# Patient Record
Sex: Male | Born: 1985 | Race: White | Hispanic: No | Marital: Single | Smoking: Never smoker
Health system: Southern US, Community
[De-identification: ages and names within clinical notes are randomized; demographics above are authoritative.]

## PROBLEM LIST (undated history)

## (undated) DIAGNOSIS — F845 Asperger's syndrome: Secondary | ICD-10-CM

## (undated) DIAGNOSIS — F4310 Post-traumatic stress disorder, unspecified: Secondary | ICD-10-CM

## (undated) DIAGNOSIS — F329 Major depressive disorder, single episode, unspecified: Secondary | ICD-10-CM

## (undated) DIAGNOSIS — F319 Bipolar disorder, unspecified: Secondary | ICD-10-CM

## (undated) DIAGNOSIS — F419 Anxiety disorder, unspecified: Secondary | ICD-10-CM

## (undated) DIAGNOSIS — F410 Panic disorder [episodic paroxysmal anxiety] without agoraphobia: Secondary | ICD-10-CM

## (undated) DIAGNOSIS — F259 Schizoaffective disorder, unspecified: Secondary | ICD-10-CM

## (undated) DIAGNOSIS — I10 Essential (primary) hypertension: Secondary | ICD-10-CM

## (undated) DIAGNOSIS — K219 Gastro-esophageal reflux disease without esophagitis: Secondary | ICD-10-CM

## (undated) DIAGNOSIS — F840 Autistic disorder: Secondary | ICD-10-CM

## (undated) DIAGNOSIS — F4481 Dissociative identity disorder: Secondary | ICD-10-CM

## (undated) DIAGNOSIS — F32A Depression, unspecified: Secondary | ICD-10-CM

## (undated) DIAGNOSIS — F250 Schizoaffective disorder, bipolar type: Secondary | ICD-10-CM

## (undated) HISTORY — DX: Autistic disorder: F84.0

## (undated) HISTORY — DX: Schizoaffective disorder, bipolar type: F25.0

## (undated) HISTORY — DX: Gastro-esophageal reflux disease without esophagitis: K21.9

## (undated) HISTORY — DX: Schizoaffective disorder, unspecified: F25.9

---
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
## 2009-04-14 ENCOUNTER — Emergency Department (HOSPITAL_COMMUNITY): Admission: EM | Admit: 2009-04-14 | Discharge: 2009-04-15 | Payer: Self-pay | Admitting: Emergency Medicine

## 2010-10-10 ENCOUNTER — Emergency Department (HOSPITAL_COMMUNITY)
Admission: EM | Admit: 2010-10-10 | Discharge: 2010-10-14 | Disposition: A | Discharge status: Other Acute Inpatient Hospital | Payer: Medicaid Other | Source: Home / Self Care | Attending: Emergency Medicine | Admitting: Emergency Medicine

## 2010-10-10 DIAGNOSIS — R45851 Suicidal ideations: Secondary | ICD-10-CM | POA: Insufficient documentation

## 2010-10-10 DIAGNOSIS — Z79899 Other long term (current) drug therapy: Secondary | ICD-10-CM | POA: Insufficient documentation

## 2010-10-10 DIAGNOSIS — F840 Autistic disorder: Secondary | ICD-10-CM | POA: Insufficient documentation

## 2010-10-10 DIAGNOSIS — F4481 Dissociative identity disorder: Secondary | ICD-10-CM | POA: Insufficient documentation

## 2010-10-10 DIAGNOSIS — F319 Bipolar disorder, unspecified: Secondary | ICD-10-CM | POA: Insufficient documentation

## 2010-10-10 DIAGNOSIS — F39 Unspecified mood [affective] disorder: Secondary | ICD-10-CM | POA: Insufficient documentation

## 2010-10-10 DIAGNOSIS — F29 Unspecified psychosis not due to a substance or known physiological condition: Secondary | ICD-10-CM | POA: Insufficient documentation

## 2010-10-10 DIAGNOSIS — F848 Other pervasive developmental disorders: Secondary | ICD-10-CM | POA: Insufficient documentation

## 2010-10-10 DIAGNOSIS — F411 Generalized anxiety disorder: Secondary | ICD-10-CM | POA: Insufficient documentation

## 2010-10-10 DIAGNOSIS — I7300 Raynaud's syndrome without gangrene: Secondary | ICD-10-CM | POA: Insufficient documentation

## 2010-10-10 DIAGNOSIS — IMO0002 Reserved for concepts with insufficient information to code with codable children: Secondary | ICD-10-CM | POA: Insufficient documentation

## 2010-10-10 LAB — CBC
HCT: 46.2 % (ref 39.0–52.0)
Hemoglobin: 15.8 g/dL (ref 13.0–17.0)
MCHC: 34.2 g/dL (ref 30.0–36.0)
MCV: 88.5 fL (ref 78.0–100.0)
Platelets: 189 10*3/uL (ref 150–400)
RBC: 5.22 MIL/uL (ref 4.22–5.81)
RDW: 12.6 % (ref 11.5–15.5)
WBC: 8.6 10*3/uL (ref 4.0–10.5)

## 2010-10-10 LAB — BASIC METABOLIC PANEL
CO2: 26 mEq/L (ref 19–32)
Calcium: 8.9 mg/dL (ref 8.4–10.5)
Chloride: 105 mEq/L (ref 96–112)
Creatinine, Ser: 1.05 mg/dL (ref 0.4–1.5)
GFR calc Af Amer: >60 mL/min (ref >60)
GFR calc non Af Amer: >60 mL/min (ref >60)
Potassium: 3.9 mEq/L (ref 3.5–5.1)
Sodium: 138 mEq/L (ref 135–145)

## 2010-10-10 LAB — DIFFERENTIAL
Basophils Absolute: 0 10*3/uL (ref 0.0–0.1)
Basophils Relative: 0 % (ref 0–1)
Eosinophils Absolute: 0.3 10*3/uL (ref 0.0–0.7)
Eosinophils Relative: 3 % (ref 0–5)
Lymphs Abs: 3.1 10*3/uL (ref 0.7–4.0)
Monocytes Absolute: 0.7 10*3/uL (ref 0.1–1.0)
Monocytes Relative: 8 % (ref 3–12)
Neutro Abs: 4.5 10*3/uL (ref 1.7–7.7)
Neutrophils Relative %: 53 % (ref 43–77)

## 2010-10-10 LAB — ETHANOL: Alcohol, Ethyl (B): <5 mg/dL (ref 0–10)

## 2010-10-11 LAB — RAPID URINE DRUG SCREEN, HOSP PERFORMED
Amphetamines: POSITIVE — AB
Barbiturates: NOT DETECTED
Cocaine: NOT DETECTED
Opiates: NOT DETECTED
Tetrahydrocannabinol: NOT DETECTED

## 2010-10-14 ENCOUNTER — Inpatient Hospital Stay (HOSPITAL_COMMUNITY)
Admission: AD | Admit: 2010-10-14 | Discharge: 2010-10-18 | DRG: 885 | Disposition: A | Discharge status: Home / Self Care | Payer: Medicaid Other | Source: Ambulatory Visit | Attending: Psychiatry | Admitting: Psychiatry

## 2010-10-14 DIAGNOSIS — I10 Essential (primary) hypertension: Secondary | ICD-10-CM

## 2010-10-14 DIAGNOSIS — F29 Unspecified psychosis not due to a substance or known physiological condition: Secondary | ICD-10-CM

## 2010-10-14 DIAGNOSIS — K219 Gastro-esophageal reflux disease without esophagitis: Secondary | ICD-10-CM

## 2010-10-14 DIAGNOSIS — R45851 Suicidal ideations: Secondary | ICD-10-CM

## 2010-10-14 DIAGNOSIS — F609 Personality disorder, unspecified: Secondary | ICD-10-CM

## 2010-10-14 DIAGNOSIS — F4481 Dissociative identity disorder: Secondary | ICD-10-CM

## 2010-10-14 DIAGNOSIS — F39 Unspecified mood [affective] disorder: Principal | ICD-10-CM

## 2010-10-15 DIAGNOSIS — F200 Paranoid schizophrenia: Secondary | ICD-10-CM

## 2010-10-19 NOTE — H&P (Signed)
NAMETREAVON, Peter Elliott              ACCOUNT NO.:  [id number]  MEDICAL RECORD NO.:  [id number]           PATIENT TYPE:  I  LOCATION:  0404                          FACILITY:  [facility]  PHYSICIAN:  Eulogio Ditch, MD DATE OF BIRTH:  03/05/1986  DATE OF ADMISSION:  10/14/2010 DATE OF DISCHARGE:                      PSYCHIATRIC ADMISSION ASSESSMENT   TIME:  1330.  IDENTIFYING INFORMATION:  A 25 year old male, single.  This is a voluntary admission.  HISTORY OF PRESENT ILLNESS:  First [facility] admission for Peter Elliott, who presents after his friend brought him to the emergency room after Peter Elliott was attempting to strangle himself.  He said one of his 48+ personalities was acting out against him and trying to strangle him.  He reported in the emergency room that he has several personalities which are aggressive towards him and others which are protective of him.  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  He was somewhat agitated in the emergency room, which he attributed to several of his personalities, which resolved without medications when he was threatened with emergency sedation.  Today he is sleep in the bed, responds to voice, but falls back asleep, unable to give a thorough history.  PAST PSYCHIATRIC HISTORY:  Currently followed as an outpatient at [facility] in [place], by Dr. Yancey Flemings and Lesia Sago, his counselor.  His history reflects a past diagnosis of auditory processing disorder, multiple personality disorder, and possibly Asperger syndrome.  He has a history of previous inpatient admission at [facility].  SOCIAL HISTORY:  Single Caucasian male who lives with a roommate.  FAMILY HISTORY:  Not available.  MEDICAL HISTORY:  Primary care provider is unknown.  MEDICAL PROBLEMS:  GERD, perennial allergies, and hypertension.  CURRENT MEDICATIONS: 1. Seroquel 500 mg p.o. q.h.s. 2.  Fluticasone 2 sprays to each nostril b.i.d. 3. Lamotrigine 100 mg q.a.m. 4. Metoprolol 50 mg q.a.m. 5. Ranitidine 150 mg q.a.m. 6. Cetirizine 10 mg daily. 7. Omeprazole 20 mg b.i.d.  DRUG ALLERGIES:  None.  PHYSICAL EXAMINATION:  Was done in the emergency room. GENERAL:  This is an obese Caucasian male in no acute distress, 5 feet 6 inches tall, 114 kg. ADMITTING VITAL SIGNS:  Temperature 98.4, pulse 114, respirations 16, blood pressure 144/79.  Normally-developed, appears generally healthy. Normal motor exam.  AIMS score is 0.  Urine drug screen positive for amphetamines.  Chemistry:  Sodium 138, potassium 3.9, chloride 105, carbon dioxide 26, BUN 13, creatinine 1.05, random glucose 159.  Alcohol level negative.  CBC normal, hemoglobin 15.8.  MENTAL STATUS EXAMINATION:  Sleepy Caucasian male, cooperative.  Answers when I call his name immediately, but readily falls back to sleep. Having trouble attending to any conversation.  Oriented to person, place, and basic situation.  Answers a few questions about medications and medical information.  Denying any dangerous thoughts today.  DIAGNOSES:  Axis I:  Mood disorder not otherwise specified. Axis II:  No diagnosis. Axis III:  Perennial allergies, high blood pressure. Axis IV:  Deferred. Axis V:  Current 35, past year not known.  PLAN:  Voluntarily admit him with a goal of  stabilizing his mood and functioning.  We have changed his Seroquel to the XR variety and will give him 500 mg q.h.s. and will continue his other routine medications and hopefully hear from family when he is able to participate better in interview.     Margaret A. Lorin Picket, N.P.   ______________________________ Eulogio Ditch, MD    MAS/MEDQ  D:  10/15/2010  T:  10/15/2010  Job:  [id number]  Electronically Signed by Kari Baars N.P. on 10/18/2010 03:44:09 PM Electronically Signed by Eulogio Ditch  on 10/19/2010 07:18:20 AM

## 2010-11-01 LAB — POCT CARDIAC MARKERS
CKMB, poc: <1 ng/mL — ABNORMAL LOW (ref 1.0–8.0)
Myoglobin, poc: 51.4 ng/mL (ref 12–200)
Troponin i, poc: <0.05 ng/mL (ref 0.00–0.09)

## 2010-11-01 LAB — CBC
HCT: 46.3 % (ref 39.0–52.0)
Hemoglobin: 16.1 g/dL (ref 13.0–17.0)
MCHC: 34.8 g/dL (ref 30.0–36.0)
MCV: 90.5 fL (ref 78.0–100.0)
Platelets: 159 10*3/uL (ref 150–400)
RBC: 5.11 MIL/uL (ref 4.22–5.81)
RDW: 12.9 % (ref 11.5–15.5)
WBC: 8.2 10*3/uL (ref 4.0–10.5)

## 2010-11-01 LAB — DIFFERENTIAL
Basophils Absolute: 0.1 10*3/uL (ref 0.0–0.1)
Basophils Relative: 1 % (ref 0–1)
Eosinophils Absolute: 0.4 10*3/uL (ref 0.0–0.7)
Eosinophils Relative: 5 % (ref 0–5)
Lymphocytes Relative: 24 % (ref 12–46)
Lymphs Abs: 2 10*3/uL (ref 0.7–4.0)
Monocytes Absolute: 0.5 10*3/uL (ref 0.1–1.0)
Monocytes Relative: 7 % (ref 3–12)
Neutro Abs: 5.2 10*3/uL (ref 1.7–7.7)
Neutrophils Relative %: 63 % (ref 43–77)

## 2010-11-01 LAB — D-DIMER, QUANTITATIVE: D-Dimer, Quant: 0.24 ug/mL-FEU (ref 0.00–0.48)

## 2010-11-01 LAB — BASIC METABOLIC PANEL
BUN: 14 mg/dL (ref 6–23)
CO2: 30 mEq/L (ref 19–32)
Calcium: 9 mg/dL (ref 8.4–10.5)
Chloride: 101 mEq/L (ref 96–112)
Creatinine, Ser: 0.96 mg/dL (ref 0.4–1.5)
GFR calc Af Amer: >60 mL/min (ref >60)
GFR calc non Af Amer: >60 mL/min (ref >60)
Glucose, Bld: 98 mg/dL (ref 70–99)
Potassium: 3.7 mEq/L (ref 3.5–5.1)
Sodium: 139 mEq/L (ref 135–145)

## 2010-11-01 LAB — URINALYSIS, ROUTINE W REFLEX MICROSCOPIC
Bilirubin Urine: NEGATIVE
Glucose, UA: NEGATIVE mg/dL
Hgb urine dipstick: NEGATIVE
Ketones, ur: NEGATIVE mg/dL
Nitrite: NEGATIVE
Protein, ur: NEGATIVE mg/dL
Specific Gravity, Urine: 1.024 (ref 1.005–1.030)
Urobilinogen, UA: 0.2 mg/dL (ref 0.0–1.0)
pH: 7 (ref 5.0–8.0)

## 2010-11-01 LAB — RAPID URINE DRUG SCREEN, HOSP PERFORMED
Amphetamines: NOT DETECTED
Barbiturates: NOT DETECTED
Benzodiazepines: NOT DETECTED
Cocaine: NOT DETECTED
Opiates: NOT DETECTED
Tetrahydrocannabinol: NOT DETECTED

## 2010-11-01 LAB — ETHANOL: Alcohol, Ethyl (B): <5 mg/dL (ref 0–10)

## 2010-11-16 NOTE — Discharge Summary (Signed)
NAMEJAKORIAN, MARENGO              ACCOUNT NO.:  [id number]  MEDICAL RECORD NO.:  [id number]           PATIENT TYPE:  I  LOCATION:  0404                          FACILITY:  BH  PHYSICIAN:  Margaret A. Scott, N.P.DATE OF BIRTH:  17-Apr-1986  DATE OF ADMISSION:  10/14/2010 DATE OF DISCHARGE:  10/18/2010                              DISCHARGE SUMMARY   IDENTIFYING INFORMATION:  This is a 25 year old male who is single. This was a voluntary admission.  HISTORY OF PRESENT ILLNESS:  This was the first [facility] admission for Schawn who presented by way of our emergency room where his friends brought him after he attempted to strangle himself.  He reported that one of his 100+ alternate personalities was acting out against him and was the personality that was trying to strangle him.  He reported in the emergency room that he had several aggressive personalities and other personalities which are protective of him.  He was somewhat agitated in the emergency room which he had attributed to several of his alter personalities, but the agitation resolved without medications when he was threatened with emergency sedation.  He is currently followed as an outpatient at [facility] in [place], [place] by Dr. Yancey Flemings and Lesia Sago, his counselor.  He reported a history of auditory processing disorder, multiple personality disorder and possible Asperger syndrome.  He also reported previous admissions to [facility].  This is a single Caucasian male, never married, who lives with a roommate.  MEDICAL EVALUATION:  Chronic medical problems include GERD, perennial allergies and hypertension, and his full physical exam was done in the emergency room.  Vital signs were normal at admission.  He appears healthy with no abnormal movements.  Urine drug screen positive for amphetamines.  Chemistry normal.  BUN 13, creatinine 1.05.  Alcohol level negative.  CBC normal.   Hemoglobin 15.8.  COURSE OF HOSPITALIZATION:  He was admitted to our stabilization unit with a goal of stabilizing his mood and affect and alleviating any suicidal thoughts.  His participation on our unit was appropriate, and he had no episodes of dissociative behavior.  We elected to change his Seroquel to 500 mg p.o. q.h.s. and continued his routine blood pressure medicine and omeprazole 20 mg daily.  We also continued lamotrigine 100 mg daily which she was taking at home.  He was gradually assimilated into the milieu.  He was given an initial working diagnosis of schizoaffective disorder.  He gave permission for [place] to talk with his sister and his current roommate with whom he had a previous relationship.  For the first couple of days, he reported that he was still hearing voices telling him to kill myself.  We made contact with the patient's psychologist, Dr. Lesia Sago from [facility], who advised [place] on his recent course of treatment and counseling with Janyth Pupa.  We arranged for follow-up appointment with him on Monday, March 26.  Meanwhile, we received his records from [facility] we had previously been admitted and they revealed a previous diagnosis of autism spectrum disorder, hypomania and dissociative identity disorder.  Meanwhile, we had elected to start him  on Haldol 5 mg q.h.s. in addition to the Seroquel XR 500 mg q.h.s., and he reported that he was feeling better and the auditory hallucinations that he was having had resolved by March 22.  By May 23, we made contact with his roommate who reported he had no concerns with him coming home.  Had spoken with him and felt that he could be safe.  He was receptive to suicide prevention education which was provided.  His roommate had also spoken with Janyth Pupa who had reported to him that he felt that his thoughts were no longer racing and that he was doing better on the Haldol.  By 03/23, he was in full contact with reality,  responding well to medication with no extrapyramidal symptoms and denying suicidal thoughts.  He was requesting discharge to return home in follow-up outpatient treatment.  DISCHARGE INSTRUCTIONS:  Follow up with Lesia Sago on March 26 at 4 o'clock p.m. and with Dr. Yancey Flemings March 30 at 9:15.  DISCHARGE DIAGNOSES:  AXIS I:  Mood disorder NOS.  Autism spectrum disorder NOS. AXIS II: Dissociative identity disorder by history. AXIS III: Perennial allergies, hypertension, GERD, AXIS IV: No diagnosis. AXIS V: Current 57, past year not known.  DISCHARGE CONDITION:  Stable.  DISCHARGE MEDICATIONS: 1. Haldol 10 mg p.o. q.h.s. 2. Quetiapine XR 500 mg q.h.s. 3. Cetirizine 10 mg daily. 4. Flonase 2 sprays both nostrils t.i.d. 5. Lamotrigine 100 mg daily. 6. Metoprolol 50 mg daily. 7. Omeprazole 20 mg b.i.d. 8. Ranitidine 150 mg daily.     Margaret A. Lorin Picket, N.P.     MAS/MEDQ  D:  11/11/2010  T:  11/11/2010  Job:  [id number]  Electronically Signed by Kari Baars N.P. on 11/12/2010 08:31:11 AM Electronically Signed by Eulogio Ditch  on 11/16/2010 05:33:50 AM

## 2011-12-15 ENCOUNTER — Encounter (HOSPITAL_COMMUNITY): Payer: Self-pay | Admitting: Emergency Medicine

## 2011-12-15 ENCOUNTER — Emergency Department (HOSPITAL_COMMUNITY)
Admission: EM | Admit: 2011-12-15 | Discharge: 2011-12-16 | Disposition: A | Discharge status: Other Acute Inpatient Hospital | Payer: Medicaid Other | Attending: Emergency Medicine | Admitting: Emergency Medicine

## 2011-12-15 DIAGNOSIS — F3289 Other specified depressive episodes: Secondary | ICD-10-CM | POA: Insufficient documentation

## 2011-12-15 DIAGNOSIS — Z79899 Other long term (current) drug therapy: Secondary | ICD-10-CM | POA: Insufficient documentation

## 2011-12-15 DIAGNOSIS — F411 Generalized anxiety disorder: Secondary | ICD-10-CM | POA: Insufficient documentation

## 2011-12-15 DIAGNOSIS — F329 Major depressive disorder, single episode, unspecified: Secondary | ICD-10-CM | POA: Insufficient documentation

## 2011-12-15 DIAGNOSIS — I10 Essential (primary) hypertension: Secondary | ICD-10-CM | POA: Insufficient documentation

## 2011-12-15 DIAGNOSIS — R45850 Homicidal ideations: Secondary | ICD-10-CM | POA: Insufficient documentation

## 2011-12-15 DIAGNOSIS — R45851 Suicidal ideations: Secondary | ICD-10-CM | POA: Insufficient documentation

## 2011-12-15 HISTORY — DX: Essential (primary) hypertension: I10

## 2011-12-15 LAB — CBC
HCT: 44.4 % (ref 39.0–52.0)
Hemoglobin: 15.6 g/dL (ref 13.0–17.0)
MCH: 30.6 pg (ref 26.0–34.0)
MCHC: 35.1 g/dL (ref 30.0–36.0)
Platelets: 154 10*3/uL (ref 150–400)
RBC: 5.1 MIL/uL (ref 4.22–5.81)
RDW: 12.9 % (ref 11.5–15.5)
WBC: 6.2 10*3/uL (ref 4.0–10.5)

## 2011-12-15 LAB — BASIC METABOLIC PANEL
BUN: 16 mg/dL (ref 6–23)
CO2: 28 mEq/L (ref 19–32)
Calcium: 9.6 mg/dL (ref 8.4–10.5)
Chloride: 101 mEq/L (ref 96–112)
Creatinine, Ser: 0.86 mg/dL (ref 0.50–1.35)
GFR calc Af Amer: >90 mL/min (ref >90)
Glucose, Bld: 95 mg/dL (ref 70–99)
Potassium: 3.7 mEq/L (ref 3.5–5.1)

## 2011-12-15 LAB — DIFFERENTIAL
Basophils Absolute: 0 10*3/uL (ref 0.0–0.1)
Basophils Relative: 1 % (ref 0–1)
Eosinophils Absolute: 0.2 10*3/uL (ref 0.0–0.7)
Eosinophils Relative: 3 % (ref 0–5)
Lymphocytes Relative: 37 % (ref 12–46)
Monocytes Absolute: 0.4 10*3/uL (ref 0.1–1.0)
Neutro Abs: 3.3 10*3/uL (ref 1.7–7.7)
Neutrophils Relative %: 53 % (ref 43–77)

## 2011-12-15 MED ORDER — ACETAMINOPHEN 325 MG PO TABS
650.0000 mg | ORAL_TABLET | ORAL | Status: DC | PRN
  Administered 2011-12-16: 650 mg via ORAL
  Filled 2011-12-15: qty 2

## 2011-12-15 MED ORDER — FLUTICASONE PROPIONATE 50 MCG/ACT NA SUSP
2.0000 | Freq: Every day | NASAL | Status: DC
  Administered 2011-12-16: 2 via NASAL
  Filled 2011-12-15 (×2): qty 16

## 2011-12-15 MED ORDER — ZOLPIDEM TARTRATE 5 MG PO TABS: 5.0000 mg | ORAL_TABLET | Freq: Every evening | ORAL | Status: DC | PRN

## 2011-12-15 MED ORDER — METOPROLOL TARTRATE 25 MG PO TABS
50.0000 mg | ORAL_TABLET | Freq: Every day | ORAL | Status: DC
  Administered 2011-12-16: 50 mg via ORAL
  Filled 2011-12-15: qty 2

## 2011-12-15 MED ORDER — LURASIDONE HCL 120 MG PO TABS
1.0000 | ORAL_TABLET | Freq: Every day | ORAL | Status: DC
  Administered 2011-12-16: 120 mg via ORAL
  Filled 2011-12-15 (×2): qty 1

## 2011-12-15 MED ORDER — NICOTINE 21 MG/24HR TD PT24
21.0000 mg | MEDICATED_PATCH | Freq: Every day | TRANSDERMAL | Status: DC
  Filled 2011-12-15: qty 1

## 2011-12-15 MED ORDER — DIVALPROEX SODIUM 250 MG PO DR TAB
500.0000 mg | DELAYED_RELEASE_TABLET | Freq: Three times a day (TID) | ORAL | Status: DC
  Administered 2011-12-15 – 2011-12-16 (×3): 500 mg via ORAL
  Filled 2011-12-15 (×3): qty 2

## 2011-12-15 MED ORDER — LORAZEPAM 1 MG PO TABS: 1.0000 mg | ORAL_TABLET | Freq: Three times a day (TID) | ORAL | Status: DC | PRN

## 2011-12-15 MED ORDER — ONDANSETRON HCL 8 MG PO TABS
4.0000 mg | ORAL_TABLET | Freq: Three times a day (TID) | ORAL | Status: DC | PRN
Start: 2011-12-15 — End: 2011-12-16

## 2011-12-15 MED ORDER — IBUPROFEN 200 MG PO TABS: 600.0000 mg | ORAL_TABLET | Freq: Three times a day (TID) | ORAL | Status: DC | PRN

## 2011-12-15 NOTE — ED Notes (Signed)
Patient belongings secured in ED locker # 9. Inventory sheet completed and in chart

## 2011-12-15 NOTE — ED Notes (Signed)
ACT team at bedside.  

## 2011-12-15 NOTE — ED Notes (Signed)
Peter Elliott with ACT is aware of patient and will see him shortly

## 2011-12-15 NOTE — ED Notes (Signed)
Patient given [place] sandwhich, pretzels, cheese stick, apple sauce, peanut butter crackers, shortbread cookies and coke.

## 2011-12-15 NOTE — ED Notes (Signed)
PT. REPORTS SUICIDAL IDEATION AND HOMICIDAL IDEATION ONSET TODAY , PLANS TO JUMP OVER BRIDGE AND DID NOT SPECIFY PLAN OF HOMICIDE , DENIES AUDITORY AND VISUAL HALLUCINATIONS . PAPER SCRUBS GIVEN TO PT.,  SECURITY PAGED TO WAND PT. AC NOTIFIED FOR SITTER.

## 2011-12-15 NOTE — ED Notes (Signed)
Informed patient that we need a urine sample. Gave patient a urinal.

## 2011-12-15 NOTE — ED Provider Notes (Signed)
History     CSN: [id number]  Arrival date & time 12/15/11  2102   First MD Initiated Contact with Patient 12/15/11 2209      No chief complaint on file.   (Consider location/radiation/quality/duration/timing/severity/associated sxs/prior treatment) Patient is a 26 y.o. male presenting with mental health disorder. The history is provided by the patient. No language interpreter was used.  Mental Health Problem The primary symptoms do not include delusions, hallucinations, negative symptoms or somatic symptoms. The current episode started today.  The onset of the illness is precipitated by a stressful event and emotional stress. The degree of incapacity that he is experiencing as a consequence of his illness is moderate. Sequelae of the illness include homelessness. Additional symptoms of the illness include feelings of worthlessness. Additional symptoms of the illness do not include no anhedonia, no insomnia, no hypersomnia, no appetite change, no fatigue, no psychomotor retardation, no headaches or no abdominal pain. He admits to suicidal ideas. He does have a plan to commit suicide. He contemplates injuring another person. He has not already  injured another person. Risk factors that are present for mental illness include a history of mental illness.    Past Medical History  Diagnosis Date  . Hypertension     History reviewed. No pertinent past surgical history.  No family history on file.  History  Substance Use Topics  . Smoking status: Never Smoker   . Smokeless tobacco: Not on file  . Alcohol Use: No      Review of Systems  Constitutional: Negative for fever, activity change, appetite change and fatigue.  HENT: Negative for congestion, sore throat, rhinorrhea, neck pain and neck stiffness.   Respiratory: Negative for cough and shortness of breath.   Cardiovascular: Negative for chest pain and palpitations.  Gastrointestinal: Negative for nausea, vomiting and abdominal  pain.  Genitourinary: Negative for dysuria, urgency, frequency and flank pain.  Musculoskeletal: Negative for myalgias, back pain and arthralgias.  Neurological: Negative for dizziness, weakness, light-headedness, numbness and headaches.  Psychiatric/Behavioral: Positive for suicidal ideas. Negative for hallucinations. The patient is nervous/anxious. The patient does not have insomnia.   All other systems reviewed and are negative.    Allergies  Review of patient's allergies indicates not on file.  Home Medications   Current Outpatient Rx  Name Route Sig Dispense Refill  . CETIRIZINE HCL 10 MG PO TABS Oral Take 10 mg by mouth daily.    Marland Kitchen DIVALPROEX SODIUM 500 MG PO TBEC Oral Take 500 mg by mouth 3 (three) times daily.    Marland Kitchen FLUTICASONE PROPIONATE 50 MCG/ACT NA SUSP Nasal Place 2 sprays into the nose daily.    Marland Kitchen LURASIDONE HCL 120 MG PO TABS Oral Take 1 tablet by mouth daily.    Marland Kitchen METOPROLOL TARTRATE 50 MG PO TABS Oral Take 50 mg by mouth daily.    Marland Kitchen OMEPRAZOLE 20 MG PO CPDR Oral Take 20 mg by mouth daily.    Marland Kitchen RANITIDINE HCL 150 MG PO TABS Oral Take 150 mg by mouth daily.      BP 104/82  Pulse 94  Temp(Src) 98.4 F (36.9 C) (Oral)  Resp 20  SpO2 98%  Physical Exam  Nursing note and vitals reviewed. Constitutional: He is oriented to person, place, and time. He appears well-developed and well-nourished. No distress.  HENT:  Head: Normocephalic and atraumatic.  Mouth/Throat: Oropharynx is clear and moist. No oropharyngeal exudate.  Eyes: Conjunctivae and EOM are normal. Pupils are equal, round, and reactive to light.  Neck: Normal range of motion. Neck supple.  Cardiovascular: Normal rate, regular rhythm, normal heart sounds and intact distal pulses.  Exam reveals no gallop and no friction rub.   No murmur heard. Pulmonary/Chest: Effort normal and breath sounds normal. No respiratory distress. He exhibits no tenderness.  Abdominal: Soft. Bowel sounds are normal. There is no  tenderness. There is no rebound and no guarding.  Musculoskeletal: Normal range of motion. He exhibits no edema and no tenderness.  Neurological: He is alert and oriented to person, place, and time. No cranial nerve deficit.  Skin: Skin is warm and dry. No rash noted.  Psychiatric: His mood appears anxious. His speech is rapid and/or pressured. He expresses homicidal and suicidal ideation. He expresses suicidal plans and homicidal plans.    ED Course  Procedures (including critical care time)   Labs Reviewed  ETHANOL  CBC  DIFFERENTIAL  BASIC METABOLIC PANEL  URINE RAPID DRUG SCREEN (HOSP PERFORMED)   No results found.   1. Suicidal ideation   2. Depression   3. Homicidal ideation       MDM  Active suicidal and homicidal ideation with plan. He wants to harm a person it is homeless shelter. He is medically clear for psychiatric evaluation. I discussed the case with the ACT team. He is here voluntarily however if he decides to leave he will need to be involuntarily committed. Psychiatric orders were placed. His home medications were placed. We'll await further recommendations regarding disposition        Dayton Bailiff, MD 12/15/11 2247

## 2011-12-16 LAB — RAPID URINE DRUG SCREEN, HOSP PERFORMED
Amphetamines: NOT DETECTED
Barbiturates: NOT DETECTED
Benzodiazepines: NOT DETECTED
Cocaine: NOT DETECTED
Opiates: NOT DETECTED
Tetrahydrocannabinol: NOT DETECTED

## 2011-12-16 NOTE — ED Notes (Signed)
Pt given [place] sandwich with snacks

## 2011-12-16 NOTE — ED Notes (Signed)
First meeting with patient. Patient resting with NAD at this time.  

## 2011-12-16 NOTE — ED Notes (Signed)
Patient waiting bed placement at [facility].

## 2011-12-16 NOTE — BH Assessment (Signed)
Assessment Note Update:  Pt referral sent to [facility] and pt was declined by Dr. Allena Katz, stating pt was not appropriate for unit as well as declined by [facility] due to aggressive behavior.  Referral sent to [facility].  Received a call from Peter Elliott, stating pt was accepted pending he was made IVC, as pt psychotic and may not be able to sign self in and to call back once IVC ppwk obtained.  IVC ppwk obtained from [facility].  This faxed to [facility].  Received call from Peter Elliott stating pt was accepted to Dr. Eliott Nine and could be transported.  Updated assessment disposition, completed assessment notification and faxed to [facility] to log.  ED staff to arrange transport via [place], as pt is IVC.     Disposition:  Disposition Disposition of Patient: Inpatient treatment program Type of inpatient treatment program: Adult Patient referred to: Other (Comment)  On Site Evaluation by:   Reviewed with Physician:  Kathreen Cosier, Rennis Harding 12/16/2011 3:28 PM
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
## 2011-12-30 ENCOUNTER — Emergency Department (HOSPITAL_COMMUNITY)
Admission: EM | Admit: 2011-12-30 | Discharge: 2011-12-30 | Disposition: A | Discharge status: Home / Self Care | Payer: Medicaid Other | Attending: Emergency Medicine | Admitting: Emergency Medicine

## 2011-12-30 ENCOUNTER — Emergency Department (HOSPITAL_COMMUNITY): Payer: Medicaid Other

## 2011-12-30 ENCOUNTER — Encounter (HOSPITAL_COMMUNITY): Payer: Self-pay | Admitting: *Deleted

## 2011-12-30 DIAGNOSIS — I10 Essential (primary) hypertension: Secondary | ICD-10-CM | POA: Insufficient documentation

## 2011-12-30 DIAGNOSIS — F848 Other pervasive developmental disorders: Secondary | ICD-10-CM | POA: Insufficient documentation

## 2011-12-30 DIAGNOSIS — M79609 Pain in unspecified limb: Secondary | ICD-10-CM | POA: Insufficient documentation

## 2011-12-30 DIAGNOSIS — Z79899 Other long term (current) drug therapy: Secondary | ICD-10-CM | POA: Insufficient documentation

## 2011-12-30 DIAGNOSIS — R079 Chest pain, unspecified: Secondary | ICD-10-CM | POA: Insufficient documentation

## 2011-12-30 DIAGNOSIS — R0602 Shortness of breath: Secondary | ICD-10-CM | POA: Insufficient documentation

## 2011-12-30 DIAGNOSIS — F411 Generalized anxiety disorder: Secondary | ICD-10-CM | POA: Insufficient documentation

## 2011-12-30 DIAGNOSIS — F4310 Post-traumatic stress disorder, unspecified: Secondary | ICD-10-CM | POA: Insufficient documentation

## 2011-12-30 HISTORY — DX: Anxiety disorder, unspecified: F41.9

## 2011-12-30 HISTORY — DX: Panic disorder (episodic paroxysmal anxiety): F41.0

## 2011-12-30 HISTORY — DX: Asperger's syndrome: F84.5

## 2011-12-30 HISTORY — DX: Post-traumatic stress disorder, unspecified: F43.10

## 2011-12-30 HISTORY — DX: Dissociative identity disorder: F44.81

## 2011-12-30 LAB — POCT I-STAT, CHEM 8
BUN: 18 mg/dL (ref 6–23)
Calcium, Ion: 1.26 mmol/L (ref 1.12–1.32)
Chloride: 103 mEq/L (ref 96–112)
Creatinine, Ser: 1 mg/dL (ref 0.50–1.35)
Glucose, Bld: 97 mg/dL (ref 70–99)
TCO2: 26 mmol/L (ref 0–100)

## 2011-12-30 LAB — POCT I-STAT TROPONIN I: Troponin i, poc: 0 ng/mL (ref 0.00–0.08)
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
## 2011-12-30 NOTE — ED Notes (Signed)
Pt states staying at the [facility] and does not have transportation. Pt given a bus pass. Ambulates without distress to discharge.

## 2011-12-30 NOTE — ED Notes (Addendum)
Per EMS pt c/o left arm pain, also c/o chest pain rated 1/10. Pt states "I just want to make sure I'm not having a heart attack because my uncle and aunt had 1, 1 week apart."
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
## 2011-12-30 NOTE — Discharge Instructions (Signed)
Chest Pain (Nonspecific) It is often hard to give a specific diagnosis for the cause of chest pain. There is always a chance that your pain could be related to something serious, such as a heart attack or a blood clot in the lungs. You need to follow up with your caregiver for further evaluation. CAUSES   Heartburn.   Pneumonia or bronchitis.   Anxiety or stress.   Inflammation around your heart (pericarditis) or lung (pleuritis or pleurisy).   A blood clot in the lung.   A collapsed lung (pneumothorax). It can develop suddenly on its own (spontaneous pneumothorax) or from injury (trauma) to the chest.   Shingles infection (herpes zoster virus).  The chest wall is composed of bones, muscles, and cartilage. Any of these can be the source of the pain.  The bones can be bruised by injury.   The muscles or cartilage can be strained by coughing or overwork.   The cartilage can be affected by inflammation and become sore (costochondritis).  DIAGNOSIS  Lab tests or other studies, such as X-rays, electrocardiography, stress testing, or cardiac imaging, may be needed to find the cause of your pain.  TREATMENT   Treatment depends on what may be causing your chest pain. Treatment may include:   Acid blockers for heartburn.   Anti-inflammatory medicine.   Pain medicine for inflammatory conditions.   Antibiotics if an infection is present.   You may be advised to change lifestyle habits. This includes stopping smoking and avoiding alcohol, caffeine, and chocolate.   You may be advised to keep your head raised (elevated) when sleeping. This reduces the chance of acid going backward from your stomach into your esophagus.   Most of the time, nonspecific chest pain will improve within 2 to 3 days with rest and mild pain medicine.  HOME CARE INSTRUCTIONS   If antibiotics were prescribed, take your antibiotics as directed. Finish them even if you start to feel better.   For the next few  days, avoid physical activities that bring on chest pain. Continue physical activities as directed.   Do not smoke.   Avoid drinking alcohol.   Only take over-the-counter or prescription medicine for pain, discomfort, or fever as directed by your caregiver.   Follow your caregiver's suggestions for further testing if your chest pain does not go away.   Keep any follow-up appointments you made. If you do not go to an appointment, you could develop lasting (chronic) problems with pain. If there is any problem keeping an appointment, you must call to reschedule.  SEEK MEDICAL CARE IF:   You think you are having problems from the medicine you are taking. Read your medicine instructions carefully.   Your chest pain does not go away, even after treatment.   You develop a rash with blisters on your chest.  SEEK IMMEDIATE MEDICAL CARE IF:   You have increased chest pain or pain that spreads to your arm, neck, jaw, back, or abdomen.   You develop shortness of breath, an increasing cough, or you are coughing up blood.   You have severe back or abdominal pain, feel nauseous, or vomit.   You develop severe weakness, fainting, or chills.   You have a fever.  THIS IS AN EMERGENCY. Do not wait to see if the pain will go away. Get medical help at once. Call your local emergency services (911 in [place] Do not drive yourself to the hospital. MAKE SURE YOU:   Understand these instructions.     Will watch your condition.   Will get help right away if you are not doing well or get worse.  Document Released: 04/23/2005 Document Revised: 07/03/2011 Document Reviewed: 02/17/2008 Exit[employer] Patient Information 2012 [employer]. 

## 2012-01-18 ENCOUNTER — Encounter (HOSPITAL_COMMUNITY): Payer: Self-pay | Admitting: Emergency Medicine

## 2012-01-18 ENCOUNTER — Emergency Department (HOSPITAL_COMMUNITY)
Admission: EM | Admit: 2012-01-18 | Discharge: 2012-01-18 | Disposition: A | Discharge status: Home / Self Care | Payer: Medicaid Other | Attending: Emergency Medicine | Admitting: Emergency Medicine

## 2012-01-18 DIAGNOSIS — Z79899 Other long term (current) drug therapy: Secondary | ICD-10-CM | POA: Insufficient documentation

## 2012-01-18 DIAGNOSIS — F603 Borderline personality disorder: Secondary | ICD-10-CM

## 2012-01-18 DIAGNOSIS — I10 Essential (primary) hypertension: Secondary | ICD-10-CM | POA: Insufficient documentation

## 2012-01-18 LAB — RAPID URINE DRUG SCREEN, HOSP PERFORMED
Amphetamines: NOT DETECTED
Barbiturates: NOT DETECTED
Opiates: NOT DETECTED
Tetrahydrocannabinol: NOT DETECTED

## 2012-01-18 LAB — BASIC METABOLIC PANEL
BUN: 14 mg/dL (ref 6–23)
CO2: 27 mEq/L (ref 19–32)
Calcium: 9.2 mg/dL (ref 8.4–10.5)
Creatinine, Ser: 0.88 mg/dL (ref 0.50–1.35)
Glucose, Bld: 92 mg/dL (ref 70–99)

## 2012-01-18 LAB — CBC
Hemoglobin: 15 g/dL (ref 13.0–17.0)
MCH: 30.2 pg (ref 26.0–34.0)
MCHC: 33.9 g/dL (ref 30.0–36.0)
MCV: 89.3 fL (ref 78.0–100.0)

## 2012-01-18 LAB — VALPROIC ACID LEVEL: Valproic Acid Lvl: <10 ug/mL — ABNORMAL LOW (ref 50.0–100.0)

## 2012-01-18 LAB — ETHANOL: Alcohol, Ethyl (B): <11 mg/dL (ref 0–11)

## 2012-01-18 MED ORDER — ZOLPIDEM TARTRATE 5 MG PO TABS: 5.0000 mg | ORAL_TABLET | Freq: Every evening | ORAL | Status: DC | PRN

## 2012-01-18 MED ORDER — NICOTINE 21 MG/24HR TD PT24
21.0000 mg | MEDICATED_PATCH | Freq: Every day | TRANSDERMAL | Status: DC
  Filled 2012-01-18: qty 1

## 2012-01-18 MED ORDER — LURASIDONE HCL 80 MG PO TABS
120.0000 mg | ORAL_TABLET | Freq: Every day | ORAL | Status: DC
  Filled 2012-01-18 (×2): qty 1

## 2012-01-18 MED ORDER — ACETAMINOPHEN 325 MG PO TABS: 650.0000 mg | ORAL_TABLET | ORAL | Status: DC | PRN

## 2012-01-18 MED ORDER — IBUPROFEN 600 MG PO TABS: 600.0000 mg | ORAL_TABLET | Freq: Three times a day (TID) | ORAL | Status: DC | PRN

## 2012-01-18 MED ORDER — FLUTICASONE PROPIONATE 50 MCG/ACT NA SUSP
2.0000 | Freq: Every day | NASAL | Status: DC
  Administered 2012-01-18: 2 via NASAL
  Filled 2012-01-18: qty 16

## 2012-01-18 MED ORDER — LORAZEPAM 1 MG PO TABS: 1.0000 mg | ORAL_TABLET | Freq: Three times a day (TID) | ORAL | Status: DC | PRN

## 2012-01-18 MED ORDER — LORATADINE 10 MG PO TABS
10.0000 mg | ORAL_TABLET | Freq: Every day | ORAL | Status: DC
  Administered 2012-01-18: 10 mg via ORAL
  Filled 2012-01-18: qty 1

## 2012-01-18 MED ORDER — LURASIDONE HCL 120 MG PO TABS: 1.0000 | ORAL_TABLET | Freq: Every day | ORAL | Status: DC

## 2012-01-18 MED ORDER — METOPROLOL TARTRATE 25 MG PO TABS
50.0000 mg | ORAL_TABLET | Freq: Every day | ORAL | Status: DC
  Administered 2012-01-18: 50 mg via ORAL
  Filled 2012-01-18: qty 2

## 2012-01-18 MED ORDER — PANTOPRAZOLE SODIUM 40 MG PO TBEC: 40.0000 mg | DELAYED_RELEASE_TABLET | Freq: Every day | ORAL | Status: DC

## 2012-01-18 MED ORDER — ONDANSETRON HCL 4 MG PO TABS
4.0000 mg | ORAL_TABLET | Freq: Three times a day (TID) | ORAL | Status: DC | PRN
Start: 2012-01-18 — End: 2012-01-19

## 2012-01-18 MED ORDER — FAMOTIDINE 20 MG PO TABS
20.0000 mg | ORAL_TABLET | Freq: Every day | ORAL | Status: DC
  Administered 2012-01-18: 20 mg via ORAL
  Filled 2012-01-18: qty 1
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
## 2012-01-18 NOTE — ED Notes (Signed)
Pt has been cleared by EDP and psych MD Dr. Jacky Kindle to be discharged home. Pt was at first very angry and beligerent but has since calmed down and is pleasasnt and cooperative. Pt is appreciative of care provided. VSS, denied pain. Bus pass provided. NAD noted.
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
## 2012-12-15 ENCOUNTER — Encounter: Payer: Self-pay | Admitting: Family Medicine

## 2012-12-15 ENCOUNTER — Ambulatory Visit (INDEPENDENT_AMBULATORY_CARE_PROVIDER_SITE_OTHER): Payer: Medicaid Other | Admitting: Family Medicine

## 2012-12-15 ENCOUNTER — Ambulatory Visit (INDEPENDENT_AMBULATORY_CARE_PROVIDER_SITE_OTHER): Payer: Medicaid Other

## 2012-12-15 VITALS — BP 146/85 | HR 99 | Temp 96.3°F | Ht 68.0 in | Wt 255.0 lb

## 2012-12-15 DIAGNOSIS — R059 Cough, unspecified: Secondary | ICD-10-CM

## 2012-12-15 DIAGNOSIS — R222 Localized swelling, mass and lump, trunk: Secondary | ICD-10-CM | POA: Insufficient documentation

## 2012-12-15 DIAGNOSIS — E669 Obesity, unspecified: Secondary | ICD-10-CM | POA: Insufficient documentation

## 2012-12-15 DIAGNOSIS — I10 Essential (primary) hypertension: Secondary | ICD-10-CM | POA: Insufficient documentation

## 2012-12-15 DIAGNOSIS — R05 Cough: Secondary | ICD-10-CM

## 2012-12-15 DIAGNOSIS — Z13220 Encounter for screening for lipoid disorders: Secondary | ICD-10-CM | POA: Insufficient documentation

## 2012-12-15 DIAGNOSIS — R635 Abnormal weight gain: Secondary | ICD-10-CM

## 2012-12-15 DIAGNOSIS — Z209 Contact with and (suspected) exposure to unspecified communicable disease: Secondary | ICD-10-CM

## 2012-12-15 LAB — LIPID PANEL
Cholesterol: 180 mg/dL (ref 0–200)
HDL: 27 mg/dL — ABNORMAL LOW (ref >39)
LDL Cholesterol: 87 mg/dL (ref 0–99)
Total CHOL/HDL Ratio: 6.7 Ratio
Triglycerides: 330 mg/dL — ABNORMAL HIGH (ref <150)
VLDL: 66 mg/dL — ABNORMAL HIGH (ref 0–40)

## 2012-12-15 MED ORDER — AMLODIPINE BESYLATE 5 MG PO TABS: 5.0000 mg | ORAL_TABLET | Freq: Every day | ORAL | Status: DC
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
## 2012-12-16 LAB — COMPLETE METABOLIC PANEL WITH GFR
ALT: 28 U/L (ref 0–53)
AST: 23 U/L (ref 0–37)
Albumin: 4.3 g/dL (ref 3.5–5.2)
Alkaline Phosphatase: 66 U/L (ref 39–117)
BUN: 13 mg/dL (ref 6–23)
CO2: 30 mEq/L (ref 19–32)
Calcium: 9.7 mg/dL (ref 8.4–10.5)
Chloride: 100 mEq/L (ref 96–112)
Creat: 1.03 mg/dL (ref 0.50–1.35)
GFR, Est African American: >89 mL/min
GFR, Est Non African American: >89 mL/min
Glucose, Bld: 116 mg/dL — ABNORMAL HIGH (ref 70–99)
Potassium: 4.7 mEq/L (ref 3.5–5.3)
Sodium: 140 mEq/L (ref 135–145)
Total Bilirubin: 0.7 mg/dL (ref 0.3–1.2)
Total Protein: 7 g/dL (ref 6.0–8.3)

## 2012-12-16 LAB — HSV(HERPES SIMPLEX VRS) I + II AB-IGG
HSV 1 Glycoprotein G Ab, IgG: 0.47 IV
HSV 2 Glycoprotein G Ab, IgG: 0.15 IV

## 2012-12-16 LAB — HIV ANTIBODY (ROUTINE TESTING W REFLEX): HIV: NONREACTIVE

## 2012-12-16 LAB — HEPATITIS B SURFACE ANTIGEN: Hepatitis B Surface Ag: NEGATIVE

## 2012-12-16 LAB — HEPATITIS C ANTIBODY: HCV Ab: NEGATIVE

## 2012-12-16 LAB — RPR

## 2012-12-21 ENCOUNTER — Ambulatory Visit (INDEPENDENT_AMBULATORY_CARE_PROVIDER_SITE_OTHER): Payer: Medicaid Other | Admitting: Surgery

## 2012-12-31 ENCOUNTER — Ambulatory Visit (INDEPENDENT_AMBULATORY_CARE_PROVIDER_SITE_OTHER): Payer: Medicaid Other | Admitting: Surgery

## 2013-01-10 ENCOUNTER — Ambulatory Visit: Payer: Medicaid Other | Admitting: Family Medicine

## 2013-02-04 ENCOUNTER — Ambulatory Visit (INDEPENDENT_AMBULATORY_CARE_PROVIDER_SITE_OTHER): Payer: Medicaid Other | Admitting: Surgery

## 2013-02-07 ENCOUNTER — Ambulatory Visit: Payer: Medicaid Other | Admitting: Family Medicine

## 2013-02-18 IMAGING — CR DG CHEST 2V
2 series · 2 of 2 positions shown · non-contrast
Comparison: Chest x-ray 04/15/2009.

CLINICAL DATA: Left-sided arm pain.  Hypertension.

CHEST - 2 VIEW

[w chest pa]
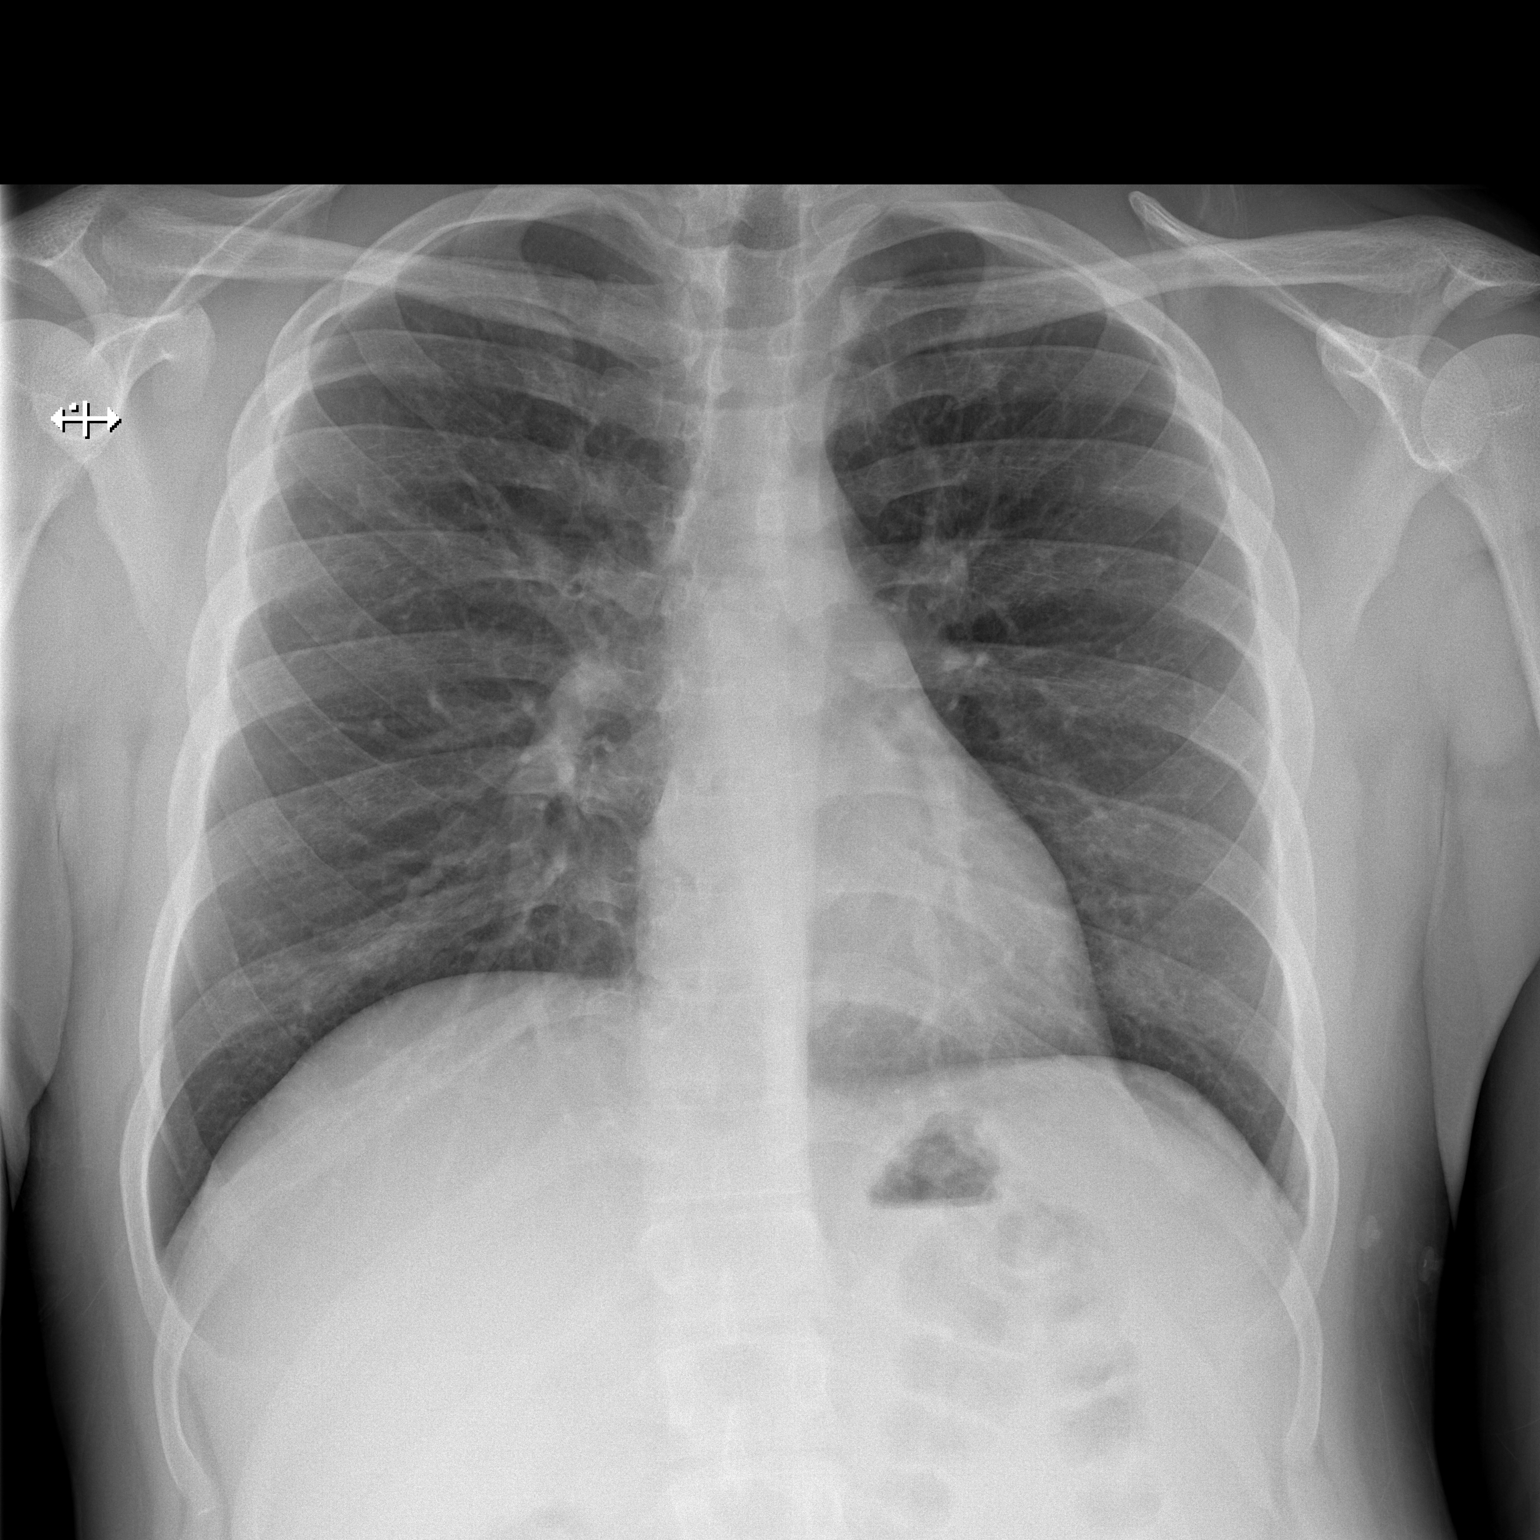

[w chest lat]
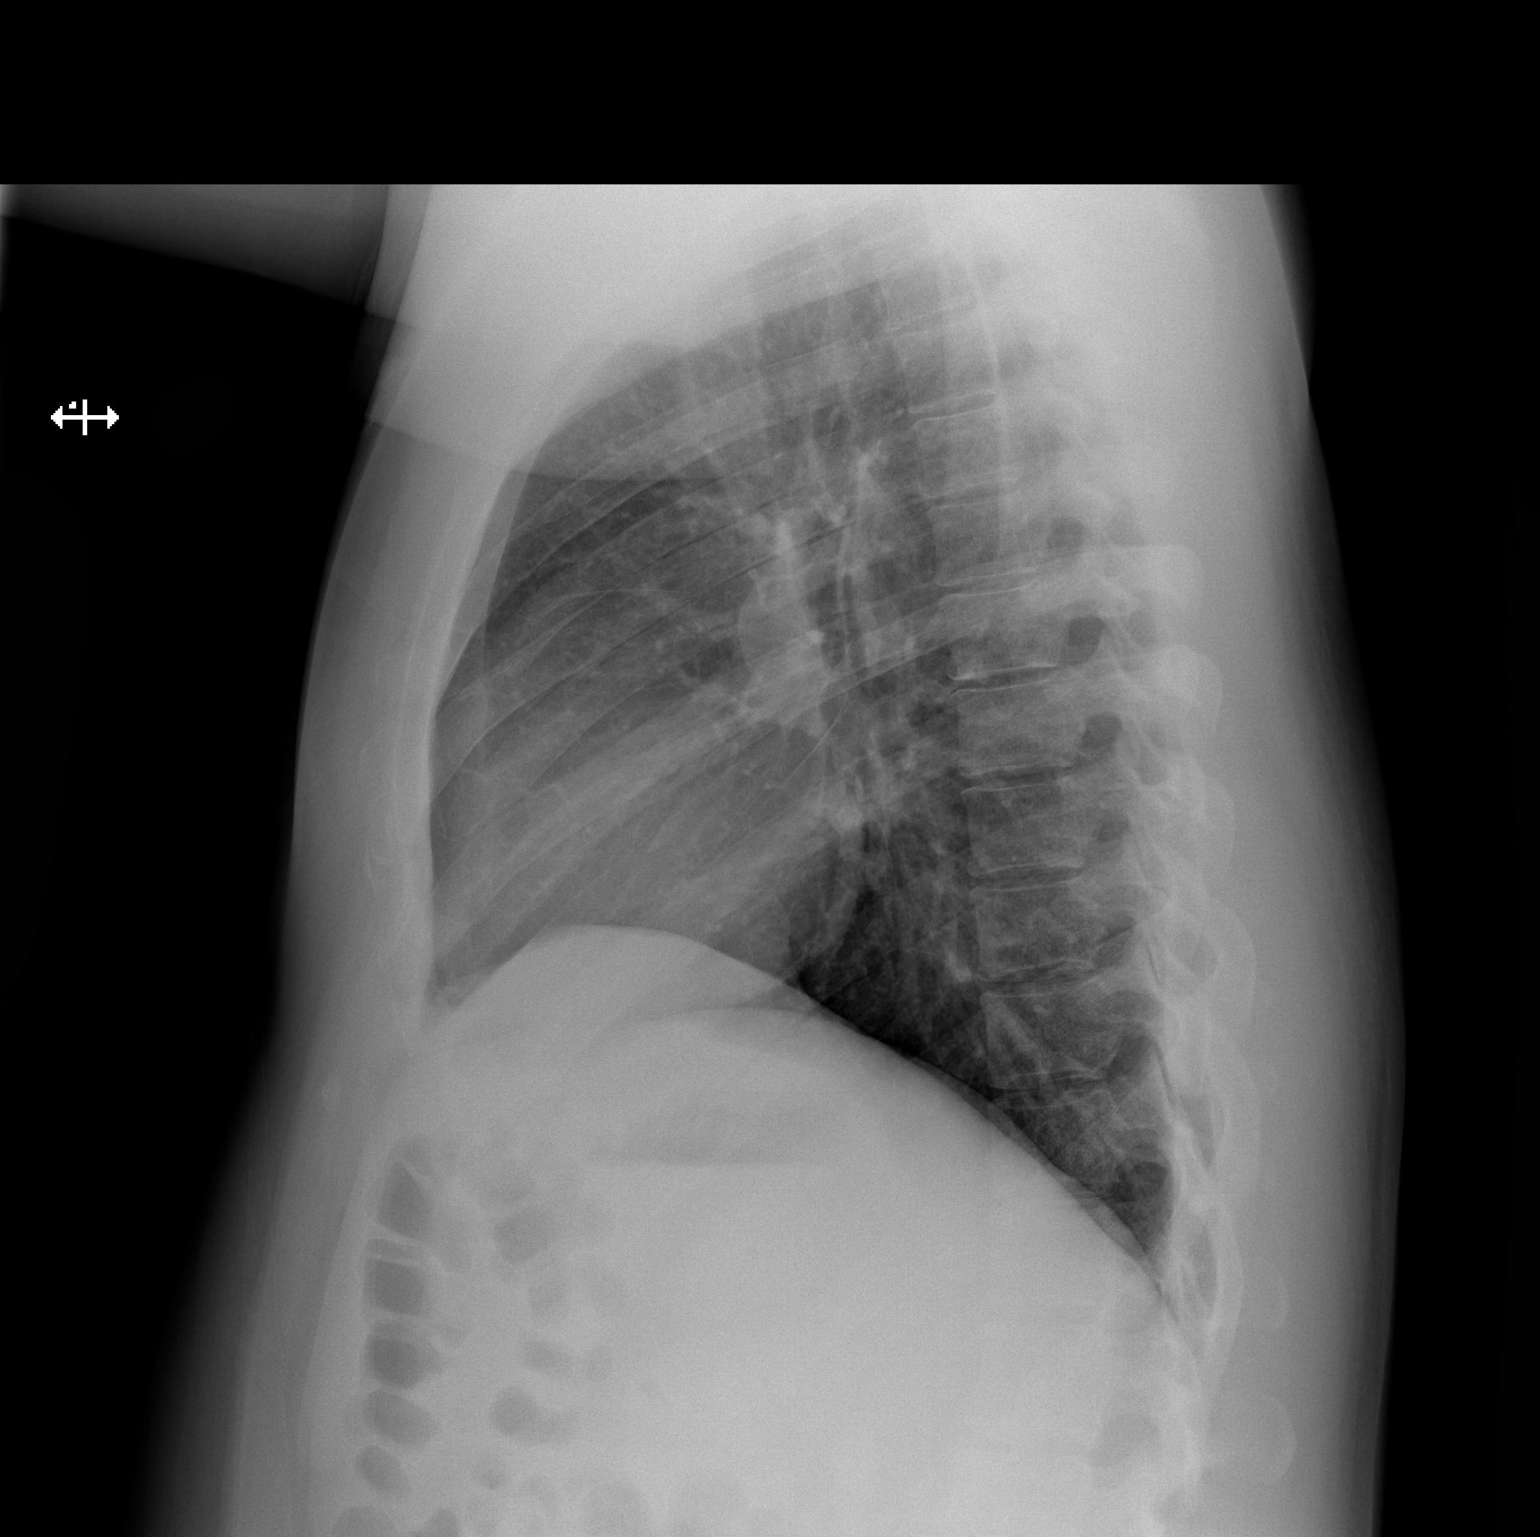

[2 of 2 positions shown; findings below may reference images not displayed]

FINDINGS: Lung volumes are normal.  No consolidative airspace
disease.  No pleural effusions.  No pneumothorax.  No pulmonary
nodule or mass noted.  Pulmonary vasculature and the
cardiomediastinal silhouette are within normal limits.
IMPRESSION: 1. No radiographic evidence of acute cardiopulmonary disease.

## 2013-05-13 ENCOUNTER — Encounter: Payer: Self-pay | Admitting: Family Medicine

## 2013-05-13 ENCOUNTER — Ambulatory Visit (INDEPENDENT_AMBULATORY_CARE_PROVIDER_SITE_OTHER): Payer: Medicaid Other | Admitting: Family Medicine

## 2013-05-13 ENCOUNTER — Encounter (INDEPENDENT_AMBULATORY_CARE_PROVIDER_SITE_OTHER): Payer: Self-pay

## 2013-05-13 VITALS — BP 135/95 | HR 106 | Temp 98.0°F | Ht 68.0 in | Wt 268.0 lb

## 2013-05-13 DIAGNOSIS — J309 Allergic rhinitis, unspecified: Secondary | ICD-10-CM

## 2013-05-13 DIAGNOSIS — Z23 Encounter for immunization: Secondary | ICD-10-CM

## 2013-05-13 DIAGNOSIS — J302 Other seasonal allergic rhinitis: Secondary | ICD-10-CM

## 2013-05-13 DIAGNOSIS — K219 Gastro-esophageal reflux disease without esophagitis: Secondary | ICD-10-CM

## 2013-05-13 MED ORDER — RANITIDINE HCL 150 MG PO TABS
150.0000 mg | ORAL_TABLET | Freq: Every day | ORAL | Status: DC
Start: 2013-05-13 — End: 2013-12-07

## 2013-05-13 MED ORDER — OMEPRAZOLE 20 MG PO CPDR: 20.0000 mg | DELAYED_RELEASE_CAPSULE | Freq: Every day | ORAL | Status: DC

## 2013-05-13 MED ORDER — FLUTICASONE PROPIONATE 50 MCG/ACT NA SUSP: 2.0000 | Freq: Every day | NASAL | Status: DC

## 2013-05-13 MED ORDER — CETIRIZINE HCL 10 MG PO TABS: 10.0000 mg | ORAL_TABLET | Freq: Every day | ORAL | Status: DC

## 2013-05-13 NOTE — Patient Instructions (Signed)

## 2013-05-13 NOTE — Progress Notes (Signed)
  Subjective:    Patient ID: Peter Elliott, male    DOB: Aug 04, 1985, 27 y.o.   MRN: [id number]  HPI  This 27 y.o. male presents for evaluation of GERD complaints and needs refills  On his allergy medicine.  Review of Systems No chest pain, SOB, HA, dizziness, vision change, N/V, diarrhea, constipation, dysuria, urinary urgency or frequency, myalgias, arthralgias or rash.     Objective:   Physical Exam Vital signs noted  Well developed well nourished male.  HEENT - Head atraumatic Normocephalic                Eyes - PERRLA, Conjuctiva - clear Sclera- Clear EOMI                Ears - EAC's Wnl TM's Wnl Gross Hearing WNL                Nose - Nares patent                 Throat - oropharanx wnl Respiratory - Lungs CTA bilateral Cardiac - RRR S1 and S2 without murmur GI - Abdomen soft Nontender and bowel sounds active x 4 Extremities - No edema. Neuro - Grossly intact.       Assessment & Plan:  GERD (gastroesophageal reflux disease) - Plan: omeprazole (PRILOSEC) 20 MG capsule, ranitidine (ZANTAC) 150 MG tablet  Seasonal allergies - Plan: cetirizine (ZYRTEC) 10 MG tablet, fluticasone (FLONASE) 50 MCG/ACT nasal spray  Need for prophylactic vaccination and inoculation against influenza  Deatra Canter FNP

## 2013-07-29 ENCOUNTER — Ambulatory Visit: Payer: Medicaid Other | Admitting: Family Medicine

## 2013-08-01 ENCOUNTER — Ambulatory Visit: Payer: Medicaid Other | Admitting: Family Medicine

## 2013-11-03 ENCOUNTER — Encounter (HOSPITAL_COMMUNITY)
Admission: EM | Disposition: A | Discharge status: Other Acute Inpatient Hospital | Payer: Self-pay | Source: Home / Self Care

## 2013-11-03 ENCOUNTER — Encounter (HOSPITAL_COMMUNITY): Payer: Self-pay | Admitting: Anesthesiology

## 2013-11-03 ENCOUNTER — Inpatient Hospital Stay (HOSPITAL_COMMUNITY)
Admission: EM | Admit: 2013-11-03 | Discharge: 2013-11-07 | DRG: 580 | Disposition: A | Discharge status: Other Acute Inpatient Hospital | Payer: Medicaid Other | Attending: Vascular Surgery | Admitting: Vascular Surgery

## 2013-11-03 ENCOUNTER — Emergency Department (HOSPITAL_COMMUNITY): Payer: Medicaid Other

## 2013-11-03 ENCOUNTER — Inpatient Hospital Stay (HOSPITAL_COMMUNITY): Payer: Medicaid Other

## 2013-11-03 ENCOUNTER — Encounter (HOSPITAL_COMMUNITY): Payer: Medicaid Other | Admitting: Certified Registered"

## 2013-11-03 ENCOUNTER — Emergency Department (HOSPITAL_COMMUNITY): Payer: Medicaid Other | Admitting: Certified Registered"

## 2013-11-03 DIAGNOSIS — IMO0002 Reserved for concepts with insufficient information to code with codable children: Secondary | ICD-10-CM | POA: Diagnosis not present

## 2013-11-03 DIAGNOSIS — S1190XA Unspecified open wound of unspecified part of neck, initial encounter: Principal | ICD-10-CM | POA: Diagnosis present

## 2013-11-03 DIAGNOSIS — R45851 Suicidal ideations: Secondary | ICD-10-CM

## 2013-11-03 DIAGNOSIS — Z6836 Body mass index (BMI) 36.0-36.9, adult: Secondary | ICD-10-CM

## 2013-11-03 DIAGNOSIS — S158XXA Injury of other specified blood vessels at neck level, initial encounter: Secondary | ICD-10-CM | POA: Diagnosis present

## 2013-11-03 DIAGNOSIS — F329 Major depressive disorder, single episode, unspecified: Secondary | ICD-10-CM

## 2013-11-03 DIAGNOSIS — E663 Overweight: Secondary | ICD-10-CM | POA: Diagnosis present

## 2013-11-03 DIAGNOSIS — S15009A Unspecified injury of unspecified carotid artery, initial encounter: Secondary | ICD-10-CM

## 2013-11-03 DIAGNOSIS — S61509A Unspecified open wound of unspecified wrist, initial encounter: Secondary | ICD-10-CM | POA: Diagnosis present

## 2013-11-03 DIAGNOSIS — F319 Bipolar disorder, unspecified: Secondary | ICD-10-CM | POA: Diagnosis present

## 2013-11-03 DIAGNOSIS — F22 Delusional disorders: Secondary | ICD-10-CM | POA: Diagnosis present

## 2013-11-03 DIAGNOSIS — F4310 Post-traumatic stress disorder, unspecified: Secondary | ICD-10-CM | POA: Diagnosis present

## 2013-11-03 DIAGNOSIS — F4481 Dissociative identity disorder: Secondary | ICD-10-CM | POA: Diagnosis present

## 2013-11-03 DIAGNOSIS — S1191XA Laceration without foreign body of unspecified part of neck, initial encounter: Secondary | ICD-10-CM | POA: Diagnosis present

## 2013-11-03 DIAGNOSIS — D62 Acute posthemorrhagic anemia: Secondary | ICD-10-CM | POA: Diagnosis not present

## 2013-11-03 DIAGNOSIS — F29 Unspecified psychosis not due to a substance or known physiological condition: Secondary | ICD-10-CM | POA: Diagnosis not present

## 2013-11-03 DIAGNOSIS — F32A Depression, unspecified: Secondary | ICD-10-CM

## 2013-11-03 DIAGNOSIS — S61512A Laceration without foreign body of left wrist, initial encounter: Secondary | ICD-10-CM | POA: Diagnosis present

## 2013-11-03 DIAGNOSIS — F603 Borderline personality disorder: Secondary | ICD-10-CM | POA: Diagnosis present

## 2013-11-03 DIAGNOSIS — X789XXA Intentional self-harm by unspecified sharp object, initial encounter: Secondary | ICD-10-CM | POA: Diagnosis present

## 2013-11-03 HISTORY — DX: Bipolar disorder, unspecified: F31.9

## 2013-11-03 HISTORY — PX: WOUND EXPLORATION: SHX6188

## 2013-11-03 LAB — COMPREHENSIVE METABOLIC PANEL
ALK PHOS: 68 U/L (ref 39–117)
ALT: 43 U/L (ref 0–53)
AST: 29 U/L (ref 0–37)
Albumin: 4 g/dL (ref 3.5–5.2)
BILIRUBIN TOTAL: 0.9 mg/dL (ref 0.3–1.2)
BUN: 18 mg/dL (ref 6–23)
CHLORIDE: 97 meq/L (ref 96–112)
CO2: 19 meq/L (ref 19–32)
Calcium: 9.2 mg/dL (ref 8.4–10.5)
Creatinine, Ser: 1.12 mg/dL (ref 0.50–1.35)
GLUCOSE: 274 mg/dL — AB (ref 70–99)
POTASSIUM: 4.4 meq/L (ref 3.7–5.3)
SODIUM: 137 meq/L (ref 137–147)
Total Protein: 7.1 g/dL (ref 6.0–8.3)

## 2013-11-03 LAB — CBC
HCT: 41.9 % (ref 39.0–52.0)
HCT: 38.8 % — ABNORMAL LOW (ref 39.0–52.0)
HEMOGLOBIN: 14 g/dL (ref 13.0–17.0)
Hemoglobin: 14.9 g/dL (ref 13.0–17.0)
MCH: 31.3 pg (ref 26.0–34.0)
MCH: 31 pg (ref 26.0–34.0)
MCHC: 35.6 g/dL (ref 30.0–36.0)
MCHC: 36.1 g/dL — AB (ref 30.0–36.0)
MCV: 88 fL (ref 78.0–100.0)
MCV: 85.8 fL (ref 78.0–100.0)
PLATELETS: 157 10*3/uL (ref 150–400)
Platelets: 244 10*3/uL (ref 150–400)
RBC: 4.76 MIL/uL (ref 4.22–5.81)
RBC: 4.52 MIL/uL (ref 4.22–5.81)
RDW: 12.3 % (ref 11.5–15.5)
RDW: 13.1 % (ref 11.5–15.5)
WBC: 17.1 10*3/uL — ABNORMAL HIGH (ref 4.0–10.5)
WBC: 15.1 10*3/uL — ABNORMAL HIGH (ref 4.0–10.5)

## 2013-11-03 LAB — PROTIME-INR
INR: 1.06 (ref 0.00–1.49)
INR: 1.09 (ref 0.00–1.49)
Prothrombin Time: 13.6 seconds (ref 11.6–15.2)
Prothrombin Time: 13.9 seconds (ref 11.6–15.2)

## 2013-11-03 LAB — POCT I-STAT 7, (LYTES, BLD GAS, ICA,H+H)
Acid-base deficit: 3 mmol/L — ABNORMAL HIGH (ref 0.0–2.0)
Bicarbonate: 23.3 meq/L (ref 20.0–24.0)
Calcium, Ion: 1.11 mmol/L — ABNORMAL LOW (ref 1.12–1.23)
HEMATOCRIT: 38 % — AB (ref 39.0–52.0)
HEMOGLOBIN: 12.9 g/dL — AB (ref 13.0–17.0)
O2 Saturation: 100 %
PCO2 ART: 42.2 mmHg (ref 35.0–45.0)
PO2 ART: 488 mmHg — AB (ref 80.0–100.0)
POTASSIUM: 4.5 meq/L (ref 3.7–5.3)
Sodium: 137 meq/L (ref 137–147)
TCO2: 25 mmol/L (ref 0–100)
pH, Arterial: 7.343 — ABNORMAL LOW (ref 7.350–7.450)

## 2013-11-03 LAB — BASIC METABOLIC PANEL
BUN: 17 mg/dL (ref 6–23)
CALCIUM: 8.4 mg/dL (ref 8.4–10.5)
CO2: 21 mEq/L (ref 19–32)
Chloride: 102 mEq/L (ref 96–112)
Creatinine, Ser: 0.94 mg/dL (ref 0.50–1.35)
GFR calc non Af Amer: >90 mL/min (ref >90)
GLUCOSE: 221 mg/dL — AB (ref 70–99)
POTASSIUM: 5.5 meq/L — AB (ref 3.7–5.3)
Sodium: 140 mEq/L (ref 137–147)

## 2013-11-03 LAB — ETHANOL: Alcohol, Ethyl (B): <11 mg/dL (ref 0–11)

## 2013-11-03 LAB — ABO/RH: ABO/RH(D): A NEG

## 2013-11-03 LAB — TRIGLYCERIDES: TRIGLYCERIDES: 214 mg/dL — AB (ref <150)

## 2013-11-03 LAB — PREPARE RBC (CROSSMATCH)

## 2013-11-03 LAB — I-STAT CG4 LACTIC ACID, ED: Lactic Acid, Venous: 3.42 mmol/L — ABNORMAL HIGH (ref 0.5–2.2)

## 2013-11-03 LAB — CDS SEROLOGY

## 2013-11-03 SURGERY — WOUND EXPLORATION
Anesthesia: General | Site: Neck

## 2013-11-03 MED ORDER — METOCLOPRAMIDE HCL 5 MG/ML IJ SOLN: 10.0000 mg | Freq: Once | INTRAMUSCULAR | Status: AC | PRN

## 2013-11-03 MED ORDER — PROPOFOL 10 MG/ML IV EMUL
INTRAVENOUS | Status: AC
  Filled 2013-11-03: qty 100

## 2013-11-03 MED ORDER — MORPHINE SULFATE 2 MG/ML IJ SOLN
1.0000 mg | INTRAMUSCULAR | Status: DC | PRN
  Administered 2013-11-03 – 2013-11-04 (×5): 4 mg via INTRAVENOUS
  Filled 2013-11-03 (×5): qty 2

## 2013-11-03 MED ORDER — PANTOPRAZOLE SODIUM 40 MG IV SOLR
40.0000 mg | Freq: Every day | INTRAVENOUS | Status: DC
  Filled 2013-11-03 (×5): qty 40

## 2013-11-03 MED ORDER — ONDANSETRON HCL 4 MG PO TABS: 4.0000 mg | ORAL_TABLET | Freq: Four times a day (QID) | ORAL | Status: DC | PRN

## 2013-11-03 MED ORDER — PHENYLEPHRINE HCL 10 MG/ML IJ SOLN
10.0000 mg | INTRAVENOUS | Status: DC | PRN
  Administered 2013-11-03: 40 ug/min via INTRAVENOUS

## 2013-11-03 MED ORDER — PROPOFOL INFUSION 10 MG/ML OPTIME
INTRAVENOUS | Status: DC | PRN
  Administered 2013-11-03: 100 ug/kg/min via INTRAVENOUS

## 2013-11-03 MED ORDER — VECURONIUM BROMIDE 10 MG IV SOLR
INTRAVENOUS | Status: AC
  Filled 2013-11-03: qty 10

## 2013-11-03 MED ORDER — VECURONIUM BROMIDE 10 MG IV SOLR
INTRAVENOUS | Status: DC | PRN
  Administered 2013-11-03: 10 mg via INTRAVENOUS

## 2013-11-03 MED ORDER — FENTANYL CITRATE 0.05 MG/ML IJ SOLN
INTRAMUSCULAR | Status: AC
  Filled 2013-11-03: qty 5

## 2013-11-03 MED ORDER — OXYCODONE HCL 5 MG/5ML PO SOLN: 5.0000 mg | Freq: Once | ORAL | Status: AC | PRN

## 2013-11-03 MED ORDER — 0.9 % SODIUM CHLORIDE (POUR BTL) OPTIME
TOPICAL | Status: DC | PRN
  Administered 2013-11-03: 1000 mL

## 2013-11-03 MED ORDER — PANTOPRAZOLE SODIUM 40 MG PO TBEC
40.0000 mg | DELAYED_RELEASE_TABLET | Freq: Every day | ORAL | Status: DC
  Administered 2013-11-04 – 2013-11-07 (×4): 40 mg via ORAL
  Filled 2013-11-03 (×3): qty 1

## 2013-11-03 MED ORDER — MIDAZOLAM HCL 2 MG/2ML IJ SOLN
INTRAMUSCULAR | Status: AC
  Filled 2013-11-03: qty 2

## 2013-11-03 MED ORDER — SODIUM CHLORIDE 0.9 % IR SOLN
Status: DC | PRN
  Administered 2013-11-03: 18:00:00

## 2013-11-03 MED ORDER — CHLORHEXIDINE GLUCONATE 0.12 % MT SOLN
15.0000 mL | Freq: Two times a day (BID) | OROMUCOSAL | Status: DC
  Administered 2013-11-03 – 2013-11-04 (×2): 15 mL via OROMUCOSAL
  Filled 2013-11-03 (×2): qty 15

## 2013-11-03 MED ORDER — OXYCODONE HCL 5 MG PO TABS: 5.0000 mg | ORAL_TABLET | Freq: Once | ORAL | Status: AC | PRN

## 2013-11-03 MED ORDER — BIOTENE DRY MOUTH MT LIQD
15.0000 mL | Freq: Four times a day (QID) | OROMUCOSAL | Status: DC
  Administered 2013-11-04 (×2): 15 mL via OROMUCOSAL

## 2013-11-03 MED ORDER — PHENYLEPHRINE HCL 10 MG/ML IJ SOLN
INTRAMUSCULAR | Status: DC | PRN
  Administered 2013-11-03: 40 ug via INTRAVENOUS
  Administered 2013-11-03: 200 ug via INTRAVENOUS

## 2013-11-03 MED ORDER — HYDROMORPHONE HCL PF 1 MG/ML IJ SOLN: 0.2500 mg | INTRAMUSCULAR | Status: DC | PRN

## 2013-11-03 MED ORDER — ONDANSETRON HCL 4 MG/2ML IJ SOLN: 4.0000 mg | Freq: Four times a day (QID) | INTRAMUSCULAR | Status: DC | PRN

## 2013-11-03 MED ORDER — KCL-LACTATED RINGERS-D5W 20 MEQ/L IV SOLN
INTRAVENOUS | Status: DC
  Administered 2013-11-03 – 2013-11-04 (×2): via INTRAVENOUS
  Filled 2013-11-03 (×5): qty 1000

## 2013-11-03 MED ORDER — FENTANYL CITRATE 0.05 MG/ML IJ SOLN
INTRAMUSCULAR | Status: DC | PRN
  Administered 2013-11-03: 100 ug via INTRAVENOUS
  Administered 2013-11-03: 150 ug via INTRAVENOUS

## 2013-11-03 MED ORDER — PROPOFOL 10 MG/ML IV EMUL
0.0000 ug/kg/min | INTRAVENOUS | Status: DC
  Administered 2013-11-03 – 2013-11-04 (×6): 50 ug/kg/min via INTRAVENOUS
  Filled 2013-11-03 (×5): qty 100

## 2013-11-03 MED ORDER — ROCURONIUM BROMIDE 50 MG/5ML IV SOLN
INTRAVENOUS | Status: AC
  Filled 2013-11-03: qty 1

## 2013-11-03 SURGICAL SUPPLY — 46 items
BLADE 11 SAFETY STRL DISP (BLADE) ×2 IMPLANT
BLADE SURG ROTATE 9660 (MISCELLANEOUS) IMPLANT
BOOT SUTURE AID YELLOW STND (SUTURE) ×2 IMPLANT
CANISTER SUCTION 2500CC (MISCELLANEOUS) ×3 IMPLANT
CANNULA VESSEL 3MM 2 BLNT TIP (CANNULA) ×2 IMPLANT
CATH ROBINSON RED A/P 18FR (CATHETERS) ×2 IMPLANT
CLIP TI WIDE RED SMALL 24 (CLIP) ×2 IMPLANT
COVER SURGICAL LIGHT HANDLE (MISCELLANEOUS) ×3 IMPLANT
DRAIN SNY 7 FPER (WOUND CARE) ×2 IMPLANT
DRAPE LAPAROSCOPIC ABDOMINAL (DRAPES) IMPLANT
DRAPE PED LAPAROTOMY (DRAPES) IMPLANT
DRAPE PROXIMA HALF (DRAPES) ×2 IMPLANT
DRAPE UTILITY 15X26 W/TAPE STR (DRAPE) ×2 IMPLANT
DRSG PAD ABDOMINAL 8X10 ST (GAUZE/BANDAGES/DRESSINGS) ×2 IMPLANT
ELECT CAUTERY BLADE 6.4 (BLADE) ×3 IMPLANT
ELECT REM PT RETURN 9FT ADLT (ELECTROSURGICAL) ×3
ELECTRODE REM PT RTRN 9FT ADLT (ELECTROSURGICAL) ×1 IMPLANT
GAUZE SPONGE 4X4 16PLY XRAY LF (GAUZE/BANDAGES/DRESSINGS) ×2 IMPLANT
GLOVE BIO SURGEON STRL SZ 6 (GLOVE) ×2 IMPLANT
GLOVE BIO SURGEON STRL SZ 6.5 (GLOVE) ×6 IMPLANT
GLOVE BIO SURGEON STRL SZ7 (GLOVE) ×2 IMPLANT
GLOVE BIO SURGEON STRL SZ8 (GLOVE) ×2 IMPLANT
GLOVE BIO SURGEONS STRL SZ 6.5 (GLOVE) ×6
GLOVE BIOGEL PI IND STRL 8 (GLOVE) ×1 IMPLANT
GLOVE BIOGEL PI INDICATOR 8 (GLOVE) ×2
GLOVE ECLIPSE 8.0 STRL XLNG CF (GLOVE) ×7 IMPLANT
GOWN STRL REUS W/ TWL LRG LVL3 (GOWN DISPOSABLE) ×2 IMPLANT
GOWN STRL REUS W/TWL LRG LVL3 (GOWN DISPOSABLE) ×6
KIT BASIN OR (CUSTOM PROCEDURE TRAY) ×3 IMPLANT
KIT ROOM TURNOVER OR (KITS) ×3 IMPLANT
LOOP VESSEL MAXI BLUE (MISCELLANEOUS) ×2 IMPLANT
LOOP VESSEL MINI RED (MISCELLANEOUS) ×2 IMPLANT
NS IRRIG 1000ML POUR BTL (IV SOLUTION) ×3 IMPLANT
PACK GENERAL/GYN (CUSTOM PROCEDURE TRAY) ×3 IMPLANT
PAD ARMBOARD 7.5X6 YLW CONV (MISCELLANEOUS) ×3 IMPLANT
SPONGE GAUZE 4X4 12PLY STER LF (GAUZE/BANDAGES/DRESSINGS) ×2 IMPLANT
STAPLER VISISTAT 35W (STAPLE) ×4 IMPLANT
SUT ETHILON 2 0 FS 18 (SUTURE) ×2 IMPLANT
SUT PROLENE 5 0 C 1 24 (SUTURE) ×4 IMPLANT
SUT PROLENE 5 0 C1 (SUTURE) ×2 IMPLANT
SUT VIC AB 3-0 SH 27 (SUTURE) ×6
SUT VIC AB 3-0 SH 27X BRD (SUTURE) IMPLANT
SUT VICRYL 4-0 PS2 18IN ABS (SUTURE) ×2 IMPLANT
TAPE UMBILICAL COTTON 1/8X30 (MISCELLANEOUS) ×2 IMPLANT
TOWEL OR 17X24 6PK STRL BLUE (TOWEL DISPOSABLE) ×3 IMPLANT
TOWEL OR 17X26 10 PK STRL BLUE (TOWEL DISPOSABLE) ×3 IMPLANT

## 2013-11-03 NOTE — Transfer of Care (Signed)
Immediate Anesthesia Transfer of Care Note  Patient: Peter Elliott  Procedure(s) Performed: Procedure(s): NECK EXPLORATION (N/A)  Patient Location: ICU  Anesthesia Type:General  Level of Consciousness: unresponsive and Patient remains intubated per anesthesia plan  Airway & Oxygen Therapy: Patient remains intubated per anesthesia plan and Patient placed on Ventilator (see vital sign flow sheet for setting)  Post-op Assessment: Post -op Vital signs reviewed and stable  Post vital signs: Reviewed and stable  Complications: No apparent anesthesia complications
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
## 2013-11-03 NOTE — Anesthesia Preprocedure Evaluation (Signed)
Anesthesia Evaluation  Patient identified by MRN, date of birth, ID band Patient confused    Reviewed: Allergy & Precautions, H&P , NPO status , Patient's Chart, lab work & pertinent test results, reviewed documented beta blocker date and time , Unable to perform ROS - Chart review only  Airway Mallampati: II TM Distance: >3 FB Neck ROM: full    Dental   Pulmonary neg pulmonary ROS,  breath sounds clear to auscultation        Cardiovascular + Peripheral Vascular Disease Rhythm:regular     Neuro/Psych    GI/Hepatic negative GI ROS, Neg liver ROS,   Endo/Other  negative endocrine ROS  Renal/GU negative Renal ROS  negative genitourinary   Musculoskeletal   Abdominal   Peds  Hematology negative hematology ROS (+)   Anesthesia Other Findings See surgeon's H&P   Reproductive/Obstetrics negative OB ROS                           Anesthesia Physical Anesthesia Plan  ASA: IV and emergent  Anesthesia Plan: General   Post-op Pain Management:    Induction: Intravenous, Rapid sequence and Cricoid pressure planned  Airway Management Planned: Video Laryngoscope Planned and Oral ETT  Additional Equipment: Arterial line and CVP  Intra-op Plan:   Post-operative Plan: Possible Post-op intubation/ventilation  Informed Consent: I have reviewed the patients History and Physical, chart, labs and discussed the procedure including the risks, benefits and alternatives for the proposed anesthesia with the patient or authorized representative who has indicated his/her understanding and acceptance.   Dental Advisory Given  Plan Discussed with: CRNA and Surgeon  Anesthesia Plan Comments:         Anesthesia Quick Evaluation
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
## 2013-11-03 NOTE — ED Notes (Signed)
NOTIFIED DR. ZAVITZ IN PERSON OF PATIENTS PANIC LAB RESULTS OF CG4+ LACTIC ACID @17 :50 PM , 11/03/2013.

## 2013-11-03 NOTE — OR Nursing (Signed)
Emergency case from ER - no consent signed, no H&P.

## 2013-11-03 NOTE — Anesthesia Postprocedure Evaluation (Signed)
  Anesthesia Post-op Note  Patient: Peter Elliott  Procedure(s) Performed: Procedure(s): NECK EXPLORATION (N/A)  Patient Location: SICU  Anesthesia Type:General  Level of Consciousness: sedated  Airway and Oxygen Therapy: Patient remains intubated per anesthesia plan and Patient placed on Ventilator (see vital sign flow sheet for setting)  Post-op Pain: none  Post-op Assessment: Post-op Vital signs reviewed, Patient's Cardiovascular Status Stable, Respiratory Function Stable, Patent Airway, No signs of Nausea or vomiting and Pain level controlled  Post-op Vital Signs: Reviewed and stable  Last Vitals:  Filed Vitals:   11/03/13 1900  BP:   Pulse: 81  Temp:   Resp: 14    Complications: No apparent anesthesia complications
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
## 2013-11-03 NOTE — ED Notes (Addendum)
2 units of PRBC given 1 unit given by rapid infuser unit number [id number] began at 17:30 finished at 17:32, second unit  [id number] given at 1733 transferred to OR.  Given by rapid infuser

## 2013-11-03 NOTE — OR Nursing (Signed)
Patient clothes cut off, placed in biohazard bag.  Items removed from patient's pockets include: cell phone with power cord, passport, Social Security card, copy of birth certificate, gold colored pendant on gold colored chair, cloth key chain with keys on it.  Items placed in clear plastic bag, labeled with patient sticker; sent to unit 2300 with patient.  Nurse on unit notified of items.
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
## 2013-11-03 NOTE — OR Nursing (Signed)
Also removed from patient's pocket - 2 silver pendants - also placed in bag with other patient belongings and labeled with patient stickers.

## 2013-11-03 NOTE — Progress Notes (Signed)
Patient arrived to SICU, came with one bag of Platelets, unit [id number], VERIFIED BY P.Timohty Renbarger AND S.MARSH.  PAGED TRAUMA MD ABOUT PATIENT ARRIVAL, RECEIVED ORDERS FOR LABS (CBC,BMET,PT/INR)

## 2013-11-04 ENCOUNTER — Encounter (HOSPITAL_COMMUNITY): Payer: Self-pay | Admitting: *Deleted

## 2013-11-04 DIAGNOSIS — F39 Unspecified mood [affective] disorder: Secondary | ICD-10-CM

## 2013-11-04 DIAGNOSIS — F603 Borderline personality disorder: Secondary | ICD-10-CM

## 2013-11-04 DIAGNOSIS — F4481 Dissociative identity disorder: Secondary | ICD-10-CM

## 2013-11-04 DIAGNOSIS — S61512A Laceration without foreign body of left wrist, initial encounter: Secondary | ICD-10-CM | POA: Diagnosis present

## 2013-11-04 DIAGNOSIS — J95821 Acute postprocedural respiratory failure: Secondary | ICD-10-CM

## 2013-11-04 DIAGNOSIS — F32A Depression, unspecified: Secondary | ICD-10-CM | POA: Diagnosis present

## 2013-11-04 DIAGNOSIS — R45851 Suicidal ideations: Secondary | ICD-10-CM

## 2013-11-04 DIAGNOSIS — F329 Major depressive disorder, single episode, unspecified: Secondary | ICD-10-CM | POA: Diagnosis present

## 2013-11-04 DIAGNOSIS — D62 Acute posthemorrhagic anemia: Secondary | ICD-10-CM | POA: Diagnosis not present

## 2013-11-04 LAB — PREPARE FRESH FROZEN PLASMA
UNIT DIVISION: 0
Unit division: 0

## 2013-11-04 LAB — PREPARE PLATELET PHERESIS: UNIT DIVISION: 0

## 2013-11-04 LAB — BLOOD PRODUCT ORDER (VERBAL) VERIFICATION

## 2013-11-04 MED ORDER — DOCUSATE SODIUM 100 MG PO CAPS
100.0000 mg | ORAL_CAPSULE | Freq: Two times a day (BID) | ORAL | Status: DC
  Administered 2013-11-04 – 2013-11-07 (×6): 100 mg via ORAL
  Filled 2013-11-04 (×4): qty 1

## 2013-11-04 MED ORDER — TRAMADOL HCL 50 MG PO TABS
50.0000 mg | ORAL_TABLET | Freq: Four times a day (QID) | ORAL | Status: DC | PRN
  Administered 2013-11-05: 100 mg via ORAL
  Filled 2013-11-04: qty 2

## 2013-11-04 MED ORDER — MORPHINE SULFATE 2 MG/ML IJ SOLN: 2.0000 mg | INTRAMUSCULAR | Status: DC | PRN

## 2013-11-04 MED ORDER — CYCLOSPORINE 0.05 % OP EMUL
1.0000 [drp] | Freq: Two times a day (BID) | OPHTHALMIC | Status: DC
Start: 2013-11-04 — End: 2013-11-07
  Administered 2013-11-04 – 2013-11-07 (×4): 1 [drp] via OPHTHALMIC
  Filled 2013-11-04 (×7): qty 1

## 2013-11-04 MED ORDER — BACITRACIN ZINC 500 UNIT/GM EX OINT
TOPICAL_OINTMENT | Freq: Two times a day (BID) | CUTANEOUS | Status: DC
  Administered 2013-11-04 – 2013-11-07 (×7): via TOPICAL
  Filled 2013-11-04 (×2): qty 15

## 2013-11-04 MED ORDER — POLYETHYLENE GLYCOL 3350 17 G PO PACK
17.0000 g | PACK | Freq: Every day | ORAL | Status: DC
  Administered 2013-11-04 – 2013-11-07 (×4): 17 g via ORAL
  Filled 2013-11-04 (×4): qty 1

## 2013-11-04 MED ORDER — BACITRACIN ZINC 500 UNIT/GM EX OINT
TOPICAL_OINTMENT | Freq: Two times a day (BID) | CUTANEOUS | Status: DC
  Filled 2013-11-04: qty 28.35
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
## 2013-11-04 NOTE — Progress Notes (Signed)
1 Day Post-Op  Subjective: Pt con't with some agitation, on propofol. Con't on vent  Objective: Vital signs in last 24 hours: Temp:  [97.7 F (36.5 C)-99.1 F (37.3 C)] 99.1 F (37.3 C) (04/10 0400) Pulse Rate:  [72-131] 80 (04/10 0700) Resp:  [14-22] 14 (04/10 0700) BP: (111-165)/(65-140) 127/70 mmHg (04/10 0700) SpO2:  [91 %-100 %] 100 % (04/10 0700) Arterial Line BP: (117-146)/(66-85) 117/66 mmHg (04/10 0700) FiO2 (%):  [40 %-100 %] 40 % (04/10 0700) Weight:  [240 lb (108.863 kg)-241 lb 6.5 oz (109.5 kg)] 241 lb 6.5 oz (109.5 kg) (04/09 2000)    Intake/Output from previous day: 04/09 0701 - 04/10 0700 In: 3446.7 [I.V.:1550.7; Blood:1806; NG/GT:90] Out: 1572 [Urine:660; Emesis/NG output:900; Drains:12] Intake/Output this shift:   Gen: sedated Lungs: CTAB Cardio: RRR GI: s/nt/nd active BS Wound: staples intact, JP SS  Lab Results:   Recent Labs  11/03/13 1723 11/03/13 1802 11/03/13 1909  WBC 17.1*  --  15.1*  HGB 14.9 12.9* 14.0  HCT 41.9 38.0* 38.8*  PLT 244  --  157   BMET  Recent Labs  11/03/13 1723 11/03/13 1802 11/03/13 1909  NA 137 137 140  K 4.4 4.5 5.5*  CL 97  --  102  CO2 19  --  21  GLUCOSE 274*  --  221*  BUN 18  --  17  CREATININE 1.12  --  0.94  CALCIUM 9.2  --  8.4   PT/INR  Recent Labs  11/03/13 1723 11/03/13 1909  LABPROT 13.6 13.9  INR 1.06 1.09   ABG  Recent Labs  11/03/13 1802  PHART 7.343*  HCO3 23.3    Studies/Results: Dg Chest Port 1 View  11/03/2013   CLINICAL DATA:  Hypoxia  EXAM: PORTABLE CHEST - 1 VIEW  COMPARISON:  None.  FINDINGS: The endotracheal tube tip is 1.0 cm above the carina. Nasogastric tube tip and side port are in the stomach. No pneumothorax. Lungs are clear. The heart size and pulmonary vascularity are normal. No adenopathy.  IMPRESSION: Endotracheal tube tip 1 cm above the carina. Suggest withdrawing endotracheal tube approximately 2.5 cm. Lungs clear. No pneumothorax.   Electronically Signed    By: Bretta BangWilliam  Woodruff M.D.   On: 11/03/2013 20:17    Anti-infectives: Anti-infectives   None      Assessment/Plan: s/p Procedure(s): NECK EXPLORATION (N/A) WEan to extubate Pt will need psych eval and sitter DC sedation Con't JP  LOS: 1 day    Axel Fillerrmando Ines Rebel 11/04/2013

## 2013-11-04 NOTE — Procedures (Signed)
Extubation Procedure Note  Patient Details:   Name: Peter Elliott DOB: [birth year] MRN: [id number]   Airway Documentation:  Airway 7.5 mm (Active)  Secured at (cm) 24 cm 11/04/2013  8:00 AM  Measured From Lips 11/04/2013  8:00 AM  Secured Location Left 11/04/2013  8:00 AM  Secured By Pink Tape 11/04/2013  8:00 AM  Cuff Pressure (cm H2O) 25 cm H2O 11/04/2013  3:25 AM  Site Condition Dry 11/04/2013  3:25 AM    Evaluation  O2 sats: stable throughout Complications: No apparent complications Patient did tolerate procedure well. Bilateral Breath Sounds: Clear Suctioning: Airway Yes PT extubated per MD order, PT placed on 4lpm [place]   Melanee Spryicole Lawson Kassius Battiste 11/04/2013, 8:53 AM
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
## 2013-11-05 DIAGNOSIS — F23 Brief psychotic disorder: Secondary | ICD-10-CM

## 2013-11-05 DIAGNOSIS — F4310 Post-traumatic stress disorder, unspecified: Secondary | ICD-10-CM

## 2013-11-05 DIAGNOSIS — F29 Unspecified psychosis not due to a substance or known physiological condition: Secondary | ICD-10-CM | POA: Diagnosis present

## 2013-11-05 DIAGNOSIS — F3130 Bipolar disorder, current episode depressed, mild or moderate severity, unspecified: Secondary | ICD-10-CM

## 2013-11-05 MED ORDER — QUETIAPINE FUMARATE 400 MG PO TABS
400.0000 mg | ORAL_TABLET | Freq: Every day | ORAL | Status: DC
  Filled 2013-11-05 (×3): qty 1

## 2013-11-05 NOTE — Progress Notes (Signed)
Received patient from 2S. Suicide precautions in place, room checked. Sitter with patient.

## 2013-11-05 NOTE — Progress Notes (Signed)
Patient believes other people are talking about him, can overhear other conversations in unit and believes "they are trying to sedate me so they can kill me".  Cooperative, allowed nurse to clean incisions, apply bacitracin.  Has not slept since 0830 11/04/2013.  Sitter at bedside.

## 2013-11-05 NOTE — Progress Notes (Signed)
Pt is acting paranoid. He thinks everyone is talking about him. He keeps looking in the hallway trying to hear what the staff is saying. He thinks the nursing staff wants to sedate him and hurt him. He is telling stories about how he has supernatural powers and rambling from one topic to another. He refuses to take any medication. He said he cannot trust anyone here. Pt is wide awake sitting in the chair with the sitter in the room. Will continue to monitor.  [name], RN

## 2013-11-05 NOTE — Progress Notes (Signed)
Earlier in the night, pt was friendly and smiling. He answered questions calmly and appropriately. He carried on a conversation. Now he becomes suspicious when staff ask him simple questions and stares intensely at the person trying to talk to him. Sitter at bedside. Will continue to monitor.  Peter EllisJessie Moksha Dorgan, RN

## 2013-11-05 NOTE — Progress Notes (Signed)
I have interviewed and examined this patient and his family this morning. I agree with the assessment and treatment plan outlined Ms. Earl Galasborne, [place]. Father in room. sitter in room. Right neck incisions appear to be healing without any complications. No hematoma. No infection. No drainage. Voice is normal.  Wounds healing satisfactorily. Ready for discharge from a surgical standpoint. Not ready for discharge from a psychiatric standpoint. Continue suicide precautions and sitters Await followup psychiatric consultation.  Angelia MouldHaywood M. Derrell LollingIngram, M.D., [facility] General and Minimally invasive Surgery Breast and Colorectal Surgery Office:   [phone] Pager:   [phone]
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
## 2013-11-05 NOTE — Progress Notes (Signed)
Patient ID: Peter Elliott, male   DOB: [birth year], 28 y.o.   MRN: [id number] Right neck wound examined  healing satisfactorily-skin staples in place Patient does not respond to questioning which is his baseline  Will continue to follow with you
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
## 2013-11-06 MED ORDER — WHITE PETROLATUM GEL
Status: AC
  Administered 2013-11-06: 0.2
  Filled 2013-11-06: qty 5
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
## 2013-11-06 NOTE — Progress Notes (Signed)
Weekend [facility] at this [facility]. Psychiatric CSW will follow to assist with support/discharge planning to inpatient treatment as needed.   Samuella BruinKristin Hilton Saephan, MSW, LCSWA Clinical Social Worker [facility] Emergency [facility]. [phone]

## 2013-11-07 ENCOUNTER — Telehealth: Payer: Self-pay | Admitting: Vascular Surgery

## 2013-11-07 ENCOUNTER — Encounter (HOSPITAL_COMMUNITY): Payer: Self-pay | Admitting: General Surgery

## 2013-11-07 ENCOUNTER — Inpatient Hospital Stay (HOSPITAL_COMMUNITY)
Admission: AD | Admit: 2013-11-07 | Discharge: 2013-11-24 | DRG: 885 | Disposition: A | Discharge status: Home / Self Care | Payer: MEDICAID | Source: Intra-hospital | Attending: Psychiatry | Admitting: Psychiatry

## 2013-11-07 DIAGNOSIS — F411 Generalized anxiety disorder: Secondary | ICD-10-CM | POA: Diagnosis present

## 2013-11-07 DIAGNOSIS — F259 Schizoaffective disorder, unspecified: Principal | ICD-10-CM | POA: Diagnosis present

## 2013-11-07 DIAGNOSIS — G4700 Insomnia, unspecified: Secondary | ICD-10-CM | POA: Diagnosis present

## 2013-11-07 DIAGNOSIS — Z598 Other problems related to housing and economic circumstances: Secondary | ICD-10-CM

## 2013-11-07 DIAGNOSIS — F32A Depression, unspecified: Secondary | ICD-10-CM

## 2013-11-07 DIAGNOSIS — F4310 Post-traumatic stress disorder, unspecified: Secondary | ICD-10-CM | POA: Diagnosis present

## 2013-11-07 DIAGNOSIS — K219 Gastro-esophageal reflux disease without esophagitis: Secondary | ICD-10-CM | POA: Diagnosis present

## 2013-11-07 DIAGNOSIS — I10 Essential (primary) hypertension: Secondary | ICD-10-CM | POA: Diagnosis present

## 2013-11-07 DIAGNOSIS — F329 Major depressive disorder, single episode, unspecified: Secondary | ICD-10-CM

## 2013-11-07 DIAGNOSIS — D62 Acute posthemorrhagic anemia: Secondary | ICD-10-CM

## 2013-11-07 DIAGNOSIS — F3110 Bipolar disorder, current episode manic without psychotic features, unspecified: Secondary | ICD-10-CM | POA: Diagnosis present

## 2013-11-07 DIAGNOSIS — R45851 Suicidal ideations: Secondary | ICD-10-CM

## 2013-11-07 DIAGNOSIS — S1191XA Laceration without foreign body of unspecified part of neck, initial encounter: Secondary | ICD-10-CM

## 2013-11-07 DIAGNOSIS — S61512A Laceration without foreign body of left wrist, initial encounter: Secondary | ICD-10-CM

## 2013-11-07 DIAGNOSIS — F29 Unspecified psychosis not due to a substance or known physiological condition: Secondary | ICD-10-CM | POA: Diagnosis present

## 2013-11-07 DIAGNOSIS — F410 Panic disorder [episodic paroxysmal anxiety] without agoraphobia: Secondary | ICD-10-CM | POA: Diagnosis present

## 2013-11-07 DIAGNOSIS — Z5987 Material hardship due to limited financial resources, not elsewhere classified: Secondary | ICD-10-CM

## 2013-11-07 DIAGNOSIS — F913 Oppositional defiant disorder: Secondary | ICD-10-CM | POA: Diagnosis present

## 2013-11-07 HISTORY — DX: Depression, unspecified: F32.A

## 2013-11-07 HISTORY — DX: Major depressive disorder, single episode, unspecified: F32.9

## 2013-11-07 MED ORDER — QUETIAPINE FUMARATE 400 MG PO TABS
400.0000 mg | ORAL_TABLET | Freq: Every day | ORAL | Status: DC
  Administered 2013-11-07 – 2013-11-08 (×2): 200 mg via ORAL
  Filled 2013-11-07: qty 2
  Filled 2013-11-07 (×3): qty 1
  Filled 2013-11-07: qty 2
  Filled 2013-11-07: qty 1
  Filled 2013-11-07: qty 2

## 2013-11-07 MED ORDER — ACETAMINOPHEN 325 MG PO TABS
650.0000 mg | ORAL_TABLET | Freq: Four times a day (QID) | ORAL | Status: DC | PRN
  Administered 2013-11-16: 650 mg via ORAL
  Filled 2013-11-07: qty 2

## 2013-11-07 MED ORDER — HYDROXYZINE HCL 25 MG PO TABS
25.0000 mg | ORAL_TABLET | Freq: Four times a day (QID) | ORAL | Status: DC | PRN
  Administered 2013-11-17: 25 mg via ORAL
  Filled 2013-11-07 (×2): qty 1

## 2013-11-07 MED ORDER — ALUM & MAG HYDROXIDE-SIMETH 200-200-20 MG/5ML PO SUSP: 30.0000 mL | ORAL | Status: DC | PRN

## 2013-11-07 MED ORDER — MAGNESIUM HYDROXIDE 400 MG/5ML PO SUSP
30.0000 mL | Freq: Every day | ORAL | Status: DC | PRN
  Administered 2013-11-17: 30 mL via ORAL

## 2013-11-07 MED ORDER — TRAZODONE HCL 50 MG PO TABS
50.0000 mg | ORAL_TABLET | Freq: Every evening | ORAL | Status: DC | PRN
Start: 2013-11-07 — End: 2013-11-08
  Administered 2013-11-07: 50 mg via ORAL
  Filled 2013-11-07 (×4): qty 1
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
## 2013-11-07 NOTE — Progress Notes (Signed)
Wound looks fine.  Awaiting disposition.  This patient has been seen and I agree with the findings and treatment plan.  Marta LamasJames O. Gae BonWyatt, III, MD, FACS [phone] (pager) [phone] (direct pager) Trauma Surgeon
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
## 2013-11-07 NOTE — Treatment Plan (Signed)
Pt has been [facility] by [facility]Lug[facility]  Bed will be ready at 1930.  Message left with Almira CoasterGina in social work.  No return call at this point.
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
## 2013-11-08 ENCOUNTER — Encounter (HOSPITAL_COMMUNITY): Payer: Self-pay | Admitting: Psychiatry

## 2013-11-08 DIAGNOSIS — R45851 Suicidal ideations: Secondary | ICD-10-CM

## 2013-11-08 DIAGNOSIS — F4310 Post-traumatic stress disorder, unspecified: Secondary | ICD-10-CM | POA: Diagnosis present

## 2013-11-08 DIAGNOSIS — F259 Schizoaffective disorder, unspecified: Secondary | ICD-10-CM | POA: Diagnosis present

## 2013-11-08 LAB — TYPE AND SCREEN
ABO/RH(D): A NEG
ANTIBODY SCREEN: NEGATIVE
UNIT DIVISION: 0
UNIT DIVISION: 0
UNIT DIVISION: 0
Unit division: 0
Unit division: 0
Unit division: 0
Unit division: 0
Unit division: 0
Unit division: 0
Unit division: 0

## 2013-11-08 MED ORDER — BACITRACIN-NEOMYCIN-POLYMYXIN OINTMENT TUBE
TOPICAL_OINTMENT | Freq: Two times a day (BID) | CUTANEOUS | Status: DC
  Administered 2013-11-08: 1 via TOPICAL
  Administered 2013-11-09 (×2): via TOPICAL
  Administered 2013-11-10 – 2013-11-11 (×2): 15 via TOPICAL
  Administered 2013-11-11: 1 via TOPICAL
  Administered 2013-11-12: 17:00:00 via TOPICAL
  Administered 2013-11-12: 15 via TOPICAL
  Administered 2013-11-13 (×2): via TOPICAL
  Administered 2013-11-14 – 2013-11-19 (×10): 15 via TOPICAL
  Administered 2013-11-19: 17:00:00 via TOPICAL
  Administered 2013-11-20: 15 via TOPICAL
  Administered 2013-11-20: 17:00:00 via TOPICAL
  Administered 2013-11-21 – 2013-11-24 (×5): 15 via TOPICAL
  Filled 2013-11-08 (×2): qty 1
  Filled 2013-11-08: qty 15

## 2013-11-08 MED ORDER — PANTOPRAZOLE SODIUM 40 MG PO TBEC
40.0000 mg | DELAYED_RELEASE_TABLET | Freq: Every day | ORAL | Status: DC
  Administered 2013-11-08 – 2013-11-24 (×17): 40 mg via ORAL
  Filled 2013-11-08 (×20): qty 1

## 2013-11-08 MED ORDER — PRAZOSIN HCL 5 MG PO CAPS
5.0000 mg | ORAL_CAPSULE | Freq: Every day | ORAL | Status: DC
  Filled 2013-11-08 (×2): qty 1

## 2013-11-08 MED ORDER — FLUOXETINE HCL 20 MG PO CAPS
40.0000 mg | ORAL_CAPSULE | Freq: Every day | ORAL | Status: DC
  Administered 2013-11-08 – 2013-11-10 (×3): 40 mg via ORAL
  Filled 2013-11-08 (×4): qty 2

## 2013-11-08 MED ORDER — LORATADINE 10 MG PO TABS
10.0000 mg | ORAL_TABLET | Freq: Every day | ORAL | Status: DC
  Administered 2013-11-08 – 2013-11-24 (×17): 10 mg via ORAL
  Filled 2013-11-08 (×20): qty 1

## 2013-11-08 MED ORDER — OLANZAPINE 10 MG PO TBDP
10.0000 mg | ORAL_TABLET | Freq: Three times a day (TID) | ORAL | Status: DC | PRN
  Administered 2013-11-10 – 2013-11-13 (×3): 10 mg via ORAL
  Filled 2013-11-08 (×3): qty 1
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
## 2013-11-08 NOTE — Tx Team (Signed)
Date:11/08/2013  Progress in Treatment:  Attending groups: Yes  Participating in groups: Yes  Taking medication as prescribed: Yes  Tolerating medication: Yes  Family/Significant othe contact made: no, working to obtain consent Patient understands diagnosis: Yes AEB reporting he is needing help after feeling depressed due to family relationship issues. Discussing patient identified problems/goals with staff: Yes  Medical problems stabilized or resolved: Yes  Denies suicidal/homicidal ideation: YEs Patient has not harmed self or Others: Yes   New problem(s) identified: none  Discharge Plan or Barriers:Will return home, follow up with [facility] for opt.  Additional comments: n/a   Reason for Continuation of Hospitalization:  Delusions  Depression Medical Issues Medication stabilization Suicidal ideation  Estimated length of stay: 3-5 days     For review of initial/current patient goals, please see plan of care.  Attendees:  Attendees:  Patient:    Family:    Physician: Dr.Akintayo MD  11/08/2013 3:25 PM  Nursing: Onnie BoerJennifer Clark, RN Case manager     Clinical Social Worker Mordecai RasmussenHannah Wong Steadham, LCSW, MSW    Other: Liborio NixonPatrice White, RN    Other: Lowanda FosterBrittany, RN   Other: Vernona RiegerLaura, NP   Other:      Mordecai RasmussenHannah Burk Hoctor, LCSW, MSW   11/08/2013

## 2013-11-08 NOTE — Progress Notes (Signed)
Adult Psychoeducational Group Note  Date:  11/08/2013 Time:  9:14 PM  Group Topic/Focus:  Wrap-Up Group:   The focus of this group is to help patients review their daily goal of treatment and discuss progress on daily workbooks.  Participation Level:  Active  Participation Quality:  Appropriate  Affect:  Appropriate  Cognitive:  Alert and Oriented  Insight: Appropriate  Engagement in Group:  Developing/Improving  Modes of Intervention:  Clarification, Exploration and Support  Additional Comments:  Patient stated that one positive for today is that he was able to see his family. Patient stated that he learned from his relapse group not to dwell in the past and live for the present.  Gerrit Heckerri Lee Saivion Goettel 11/08/2013, 9:14 PM
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
## 2013-11-08 NOTE — [facility] Group Notes (Signed)
[facility] LCSW Group Therapy  11/08/2013 1:04 PM  Type of Therapy:  Group Therapy  Participation Level:  Active  Participation Quality:  Intrusive  Affect:  Blunted  Cognitive:  Alert and Oriented  Insight:  Improving  Engagement in Therapy:  Monopolizing  Modes of Intervention:  Discussion, Exploration and Problem-solving  Summary of Progress/Problems:  Group today consisted of processing good and bad habits and applying cause and effect with regards to outcomes.  Members were asked to process their daily activities categorizing them into good and bad and looking at the deeper meaning of why they respond and feel certain ways.  Peter Elliott was very quiet during first part of group, however started on a tangent about others bad habits rather than focusing on his own. He was able to redirect and focus on a good habit which is writing poetry.  He shares poetry helps him get his feelings out and work on what is going on inside him.  His bad habit is being impulsive and being too honest and blunt causing people to not be close to him.  He shares he is a very judgmental person and this too is a negative response/habit.  Peter Elliott 11/08/2013, 1:04 PM
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
## 2013-11-08 NOTE — Progress Notes (Signed)
Observation Note: Peter Elliott asked to speak with LCSW 1:1 asking for forgiveness for his rudeness and not giving information about his family during PSA.  Peter Elliott presents with a new accent during this discussion. 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     Patient reports he changes accidents without knowing and needs someone to help remind him to go back to his normal accent.  At times he begins talking too fast and falls back into his baseline, but then pick his [place] accent back up.  He reports he wants his family involved in treatment and wants LCSW to call and let them be aware of his wishes and plans.  Patient is very labile when talking about his family as he currently is on good terms and happy with family, but earlier today he was angry and upset reporting nothing regarding family, but problems.  Peter Elliott, MSW, LCSW Clinical Lead [phone]

## 2013-11-08 NOTE — Progress Notes (Addendum)
D   Pt was pleasant on approach  He is less irritable and confrontive   He said he got a good nights sleep and feels much better  He is superficially bright and requested nurse to do a dressing change on his wrist with great detail in how it should be done   He can be intrusive and acts like he is not aware when there are others behind him at the medication window and just stands there talking about anything   He denies suicidal ideation at present  Continues to think he would be better off away from his family A   Verbal support given  Medications administered and effectiveness monitored   Redirection as needed   theraputic limits set when pt is being unreasonable   Q 15 min checks R   Pt safe at present Pt ended up refusing his nighttime medications and began to talk about his faith and that would keep him well  And help him

## 2013-11-08 NOTE — Tx Team (Signed)
Initial Interdisciplinary Treatment Plan  PATIENT STRENGTHS: (choose at least two) Physical Health Supportive family/friends  PATIENT STRESSORS: Legal issue   PROBLEM LIST: Problem List/Patient Goals Date to be addressed Date deferred Reason deferred Estimated date of resolution  Psychosis 11/08/13                                                      DISCHARGE CRITERIA:  Adequate post-discharge living arrangements Motivation to continue treatment in a less acute level of care Verbal commitment to aftercare and medication compliance  PRELIMINARY DISCHARGE PLAN: Return to previous living arrangement  PATIENT/FAMIILY INVOLVEMENT: This treatment plan has been presented to and reviewed with the patient, Peter Elliott, and/or family member.  The patient and family have been given the opportunity to ask questions and make suggestions.  Peter Elliott Peter Elliott 11/08/2013, 1:08 AM

## 2013-11-08 NOTE — Progress Notes (Signed)
D: Pt presents anxious and angry this morning. Pt presents with flight of ideas, hyper verbal, fidgety, irritable and tangential. Pt verbalized to Clinical research associatewriter, "The service here isn't shit". Pt stated that his father is out to get him. Pt is paranoid and delusional. Pt believes that he have supernatural powers and can see into the future. Pt has poor insight for treatment and stated that nothing works for him because he's not the problem, his father is. A: Medications reviewed and  administered as ordered per MD. Verbal support given. Pt encouraged to attend groups. 15 minute checks performed for safety. Pt requires redirecting at times for impulsive behaviors. 15 minute checks performed for safety. R: Pt safety maintained at this time.
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
## 2013-11-09 ENCOUNTER — Encounter: Payer: Self-pay | Admitting: Vascular Surgery

## 2013-11-09 MED ORDER — TRAZODONE HCL 100 MG PO TABS
100.0000 mg | ORAL_TABLET | Freq: Every evening | ORAL | Status: DC | PRN
  Administered 2013-11-09: 100 mg via ORAL
  Filled 2013-11-09: qty 1

## 2013-11-09 NOTE — [facility] Group Notes (Signed)
[facility]   11/09/2013 9:45 AM  Participation Quality:  Gamey, Inattentive   Mood/Affect:  Labile  Depression Rating:  Reports 0  Anxiety Rating:  Reports 0   Thoughts of Suicide:  No Will you contract for safety?   Yes  Current AVH:  No  Plan for Discharge/Comments:  Peter Elliott is very gamey and indirect when answering questions AEB "I guess so".  He reports he will go back home with his father and [facility].  Transportation Means: family  Supports: mother/father  American Family InsuranceHannah N Sanjith Elliott

## 2013-11-09 NOTE — Progress Notes (Signed)
Pt had refused his medications at bedtime but later came back and agreed to take 200 of the 400 of seroquel that was ordered

## 2013-11-09 NOTE — [facility] Group Notes (Signed)
[facility]  11/09/2013  12:35 PM  Type of Therapy:  [facility] Presentation   Participation Level:  Minimal  Participation Quality:  Inattentive  Affect:  Flat  Cognitive:  Appropriate  Insight:  Lacking  Engagement in Therapy:  Poor  Modes of Intervention:  Discussion, Education and Socialization   Summary of Progress/Problems:  Onalee HuaDavid from [employer] came to present his recovery story and play the guitar.  [name] listened quietly while the speaker told his story, but left after 20 minutes in group.  He did not return back.    Peter Elliott   11/09/2013  12:35 PM

## 2013-11-09 NOTE — Progress Notes (Signed)
D: Pt presents anxious, hyper verbal, flight of ideas, hyper religous, and impulsive. Pt rates depression 4/10. Pt has poor insight for treatment and stated  he will pick and choose what meds he take. Pt offered several options by MD and pt continued to refuse each option. Pt stated that he knew what he was doing when he stabbed himself in the neck. Pt stated that he knew he wasn't going to die because he cut the right artery. A: Medications administered as ordered per MD. Verbal support given. Pt encouraged to attend groups. Pt requires redirecting by staff for inappropriate behaviors on the unit. 15 minute checks performed for safety. R: Pt safety maintained at this time.

## 2013-11-09 NOTE — Progress Notes (Addendum)
[facility] MD Progress Note  11/09/2013 10:55 AM Peter Elliott  MRN:  [id number] Subjective:" I am not going to take any of the anti-psychotic or mood stabilizer that you prescribed for me. I am only going to take Prozac. Allah is going to help me cope with my problem but don't misunderstand me, the fact that I have special connection with Allah does not make me a muslim. You know that I have special power, I can see into the past and the future and guess what I can heal myself if I want to.''  Objective: Patient is seen with treatment team members and chart is reviewed. He has no insight into his mental illness. He minimizes his symptoms. He has history of PTSD and Schizoaffective disorder bipolar type and was admitted to the inpatient after he attempted suicide by cutting his throat and left arm with a knife. He lost a lot of blood and had to be taking to the operating room for emergency surgery. However, patient is boasting today that he knew he was not going to die the moment he slit his own throat: " I have the power to see the future and the past don't forget.". He remains delusional as evidenced by him thinking that people are talking and  plotting against him. "People just piss me off. I have never liked people. My Dad accuses me of stuff and tries to control me. I should have just left my family. I feel like now I have a second chance. I don't think magic is reality. Avoiding family is the only thing that helps me. Medication has never worked." . He is very angry and irritable today. He continues to endorse suicidal thoughts but able to contract for safety.   Diagnosis:   DSM5: Schizophrenia Disorders:  Delusional Disorder (297.1) Obsessive-Compulsive Disorders:   Trauma-Stressor Disorders:  Posttraumatic Stress Disorder (309.81) Substance/Addictive Disorders:   Depressive Disorders:  Labile, irritable mood Total Time spent with patient: 30 minutes  Axis I: Schizoaffective disorder bipolar type            Post traumatic disorder  Axis II: Cluster B Traits Axis III:  Past Medical History  Diagnosis Date  . Hypertension    Axis IV: other psychosocial or environmental problems, problems related to social environment and problems with primary support group  ADL's:  Intact  Sleep: Fair  Appetite:  Fair  Suicidal Ideation: yes Plan:  denies Intent:  denies Means:  denies Homicidal Ideation:  denies AEB (as evidenced by):  Psychiatric Specialty Exam: Physical Exam  Psychiatric: His affect is angry and labile. His speech is rapid and/or pressured and tangential. He is agitated and aggressive. Thought content is paranoid and delusional. Cognition and memory are normal. He expresses impulsivity. He exhibits a depressed mood. He expresses suicidal ideation.    Review of Systems  Constitutional: Negative.   HENT: Negative.   Eyes: Negative.   Respiratory: Negative.   Cardiovascular: Negative.   Gastrointestinal: Negative.   Genitourinary: Negative.   Musculoskeletal: Negative.   Skin: Negative.   Neurological: Negative.   Endo/Heme/Allergies: Negative.   Psychiatric/Behavioral: Positive for depression and suicidal ideas. The patient is nervous/anxious and has insomnia.     Blood pressure 140/89, pulse 116, temperature 97.7 F (36.5 C), temperature source Oral, resp. rate 20, height 5' 4.5" (1.638 m), weight 103.42 kg (228 lb).Body mass index is 38.55 kg/(m^2).  General Appearance: Disheveled  Eye Contact::  Good  Speech:  Pressured  Volume:  Increased  Mood:  Angry  and Irritable  Affect:  Labile and Full Range  Thought Process:  Circumstantial and Disorganized  Orientation:  Full (Time, Place, and Person)  Thought Content:  Delusions and grandiose  Suicidal Thoughts:  Yes.  without intent/plan  Homicidal Thoughts:  No  Memory:  Immediate;   Fair Recent;   Fair Remote;   Fair  Judgement:  Poor  Insight:  Lacking  Psychomotor Activity:  Increased  Concentration:   Fair  Recall:  FiservFair  Fund of Knowledge:Fair  Language: Good  Akathisia:  No  Handed:  Right  AIMS (if indicated):     Assets:  Communication Skills Physical Health Social Support  Sleep:  Number of Hours: 5   Musculoskeletal: Strength & Muscle Tone: within normal limits Gait & Station: normal Patient leans: N/A  Current Medications: Current Facility-Administered Medications  Medication Dose Route Frequency Provider Last Rate Last Dose  . acetaminophen (TYLENOL) tablet 650 mg  650 mg Oral Q6H PRN Kerry HoughSpencer E Simon, PA-C      . alum & mag hydroxide-simeth (MAALOX/MYLANTA) 200-200-20 MG/5ML suspension 30 mL  30 mL Oral Q4H PRN Kerry HoughSpencer E Simon, PA-C      . FLUoxetine (PROZAC) capsule 40 mg  40 mg Oral Daily Koleson Reifsteck   40 mg at 11/09/13 0807  . hydrOXYzine (ATARAX/VISTARIL) tablet 25 mg  25 mg Oral Q6H PRN Kerry HoughSpencer E Simon, PA-C      . loratadine (CLARITIN) tablet 10 mg  10 mg Oral Daily Kyia Rhude   10 mg at 11/09/13 0807  . magnesium hydroxide (MILK OF MAGNESIA) suspension 30 mL  30 mL Oral Daily PRN Kerry HoughSpencer E Simon, PA-C      . neomycin-bacitracin-polymyxin (NEOSPORIN) ointment   Topical BID Fransisca KaufmannLaura Davis, NP      . OLANZapine zydis (ZYPREXA) disintegrating tablet 10 mg  10 mg Oral Q8H PRN Anndee Connett      . pantoprazole (PROTONIX) EC tablet 40 mg  40 mg Oral Daily Layna Roeper   40 mg at 11/09/13 0807  . QUEtiapine (SEROQUEL) tablet 400 mg  400 mg Oral QHS Kerry HoughSpencer E Simon, PA-C   200 mg at 11/08/13 2303  . traZODone (DESYREL) tablet 100 mg  100 mg Oral QHS PRN Sameria Morss        Lab Results: No results found for this or any previous visit (from the past 48 hour(s)).  Physical Findings: AIMS: Facial and Oral Movements Muscles of Facial Expression: None, normal Lips and Perioral Area: None, normal Jaw: None, normal Tongue: None, normal,Extremity Movements Upper (arms, wrists, hands, fingers): None, normal Lower (legs, knees, ankles, toes): None, normal, Trunk  Movements Neck, shoulders, hips: None, normal, Overall Severity Severity of abnormal movements (highest score from questions above): None, normal Incapacitation due to abnormal movements: None, normal Patient's awareness of abnormal movements (rate only patient's report): No Awareness, Dental Status Current problems with teeth and/or dentures?: No Does patient usually wear dentures?: No  CIWA:    COWS:     Treatment Plan Summary: Daily contact with patient to assess and evaluate symptoms and progress in treatment Medication management  Plan:  1. Admit for crisis management and stabilization.  2. Medication management to reduce current symptoms to base line and improve the patient's level of functioning: -Continue Seroquel 400mg  po Qhs for mood/psychosis/delusion -Continue Prozac 40mg  po daily for depression -Continue Trazodone 100mg  qhs PRN for insomnia 3. Develop treatment plan to decrease risk of relapse upon discharge of psychotic symptoms and the need for readmission.  5. Group  therapy to facilitate development of healthy coping skills to use for psychosis.  6. Health care follow up as needed for medical problems. Apply Neosporin ointment twice daily to staples along right neck and to wrist. BMP, T3, TSH to be drawn on 11/09/13.  7. Discharge plan to include therapy to help patient cope with stressors.  8. Call for Consult with Hospitalist for additional specialty patient services as needed.    Medical Decision Making Problem Points:  Established problem, worsening (2), Review of last therapy session (1) and Review of psycho-social stressors (1) Data Points:  Order Aims Assessment (2) Review or order clinical lab tests (1) Review of medication regiment & side effects (2) Review of new medications or change in dosage (2)  I certify that inpatient services furnished can reasonably be expected to improve the patient's condition.   Thedore MinsMojeed Meital Riehl, MD 11/09/2013, 10:55 AM

## 2013-11-09 NOTE — Progress Notes (Signed)
The focus of this group is to help patients review their daily goal of treatment and discuss progress on daily workbooks. Pt attended the evening group session and responded to all discussion prompts from the Writer. Pt shared that the highlight of his day was a visit from his Mother, which he then described as being the worst part of his day. "She pissed me off like she always does. She did not leave on good terms." Pt's only additional request from Nursing Staff this evening was to receive towels, which were given to him following group. Pt's said that upon discharge he planned to disown his family and move somewhere beyond their reach as part of his plan to stay well. Pt's affect was manic and hyperverbal.

## 2013-11-10 ENCOUNTER — Encounter: Payer: Medicaid Other | Admitting: Vascular Surgery

## 2013-11-10 MED ORDER — BUPROPION HCL ER (XL) 150 MG PO TB24
150.0000 mg | ORAL_TABLET | Freq: Every day | ORAL | Status: DC
  Administered 2013-11-10 – 2013-11-13 (×4): 150 mg via ORAL
  Filled 2013-11-10 (×7): qty 1

## 2013-11-10 MED ORDER — LURASIDONE HCL 40 MG PO TABS
40.0000 mg | ORAL_TABLET | Freq: Every day | ORAL | Status: DC
  Administered 2013-11-10 – 2013-11-11 (×2): 40 mg via ORAL
  Filled 2013-11-10 (×3): qty 1

## 2013-11-10 MED ORDER — QUETIAPINE FUMARATE 200 MG PO TABS
200.0000 mg | ORAL_TABLET | Freq: Every day | ORAL | Status: DC
  Filled 2013-11-10: qty 1

## 2013-11-10 NOTE — Progress Notes (Signed)
Patient ID: Peter Elliott, male   DOB: January 29, 1986, 28 y.o.   MRN: [id number] Patient mother spoke with this nurse after patient agreed to add her to the consent list.  Mother informed me that patient is autistic and [facility] [place]. Patient's diagnosis of autism could not be found in the chart which mother found upsetting.

## 2013-11-10 NOTE — Progress Notes (Signed)
Patient ID: Peter Elliott, male   DOB: 25-Sep-1985, 28 y.o.   MRN: [id number] D: pt. In dayroom interacting and watching TV. Pt. Stitches to throat area intact, dressing to right wrist intact. Pt. Reports "I'm not going to take your medication, I rely on my faith to help me and no one else" "I know my rights and if I'm not a threat I don't have to take it, f_ _ _, you and all the staff" "I'm just to smart, too intelligent for y'all"  A: Writer introduces self to client and attempts to review medications and administration schedule, but pt. Continues to curse, rant, and rave. Staff will monitor q2415min for safety. R: Pt. Is safe on the unit and attends group.

## 2013-11-10 NOTE — Progress Notes (Signed)
Patient ID: Peter Elliott, male   DOB: July 21, 1986, 28 y.o.   MR[id number] [facility]  11/10/2013 1:33 PM Peter Elliott  MRN:  [id number] Subjective: Patient states "I talked to my mother about medicine. I am very worried about gaining weight. I don't want Seroquel or Haldol. I get depressed when I gain weight, which makes everything worse. I believe in Allah. He knew I would not die when I hurt myself. Islam is the only religion that gives me peace. I am still feeling depressed. I think that wellbutrin and latuda are the best medicines for me. I discussed them with my mother and would like you discuss it with the Doctor."   Objective: Patient is visible on the unit and is attending the scheduled groups. Peter Elliott is pleasant when speaking to Clinical research associatewriter today but continues to express delusions about being able to predict the future. He talks about his faith in the Islam God Allah and how he protected him from death during his suicide attempt. Patient talks about having a productive visit with his mother and his love for his family. He admits that living with them at a grown age has caused "a lot of head butting." Peter Elliott talks about his desire to live independently. Nursing staff report that the patient's mood has actually been very labile. Last night he is reported to have become agitated at medication pass using profanity and stating that he only need faith to help him. He also expressed grandiose delusions about being more intelligent than staff. Today patient was hyperverbal, had pressured speech and flight of ideas during assessment. He reports having decreased thoughts of suicide.   Diagnosis:   DSM5: Schizophrenia Disorders:  Delusional Disorder (297.1) Obsessive-Compulsive Disorders:   Trauma-Stressor Disorders:  Posttraumatic Stress Disorder (309.81) Substance/Addictive Disorders:   Depressive Disorders:  Labile, irritable mood Total Time spent with patient: 20 minutes Axis I:  Schizoaffective disorder bipolar type           Post traumatic disorder  Axis II: Cluster B Traits Axis III:  Past Medical History  Diagnosis Date  . Hypertension    Axis IV: other psychosocial or environmental problems, problems related to social environment and problems with primary support group  ADL's:  Intact  Sleep: Fair  Appetite:  Fair  Suicidal Ideation: yes Plan:  denies Intent:  denies Means:  denies Homicidal Ideation:  denies AEB (as evidenced by):  Psychiatric Specialty Exam: Physical Exam  Psychiatric: His affect is labile. His speech is rapid and/or pressured and tangential. He is hyperactive. Thought content is paranoid and delusional. Cognition and memory are normal. He expresses impulsivity. He exhibits a depressed mood.    Review of Systems  Constitutional: Negative.   HENT: Negative.   Eyes: Negative.   Respiratory: Negative.   Cardiovascular: Negative.   Gastrointestinal: Negative.   Genitourinary: Negative.   Musculoskeletal: Negative.   Skin: Negative.   Neurological: Negative.   Endo/Heme/Allergies: Negative.   Psychiatric/Behavioral: Positive for depression and suicidal ideas. The patient is nervous/anxious and has insomnia.     Blood pressure 151/88, pulse 103, temperature 98.1 F (36.7 C), temperature source Oral, resp. rate 20, height 5' 4.5" (1.638 m), weight 103.42 kg (228 lb).Body mass index is 38.55 kg/(m^2).  General Appearance: Disheveled  Eye Contact::  Good  Speech:  Pressured  Volume:  Increased  Mood:  Angry and Irritable  Affect:  Labile and Full Range  Thought Process:  Circumstantial and Disorganized  Orientation:  Full (Time, Place, and Person)  Thought Content:  Delusions and grandiose  Suicidal Thoughts:  Yes.  without intent/plan  Homicidal Thoughts:  No  Memory:  Immediate;   Fair Recent;   Fair Remote;   Fair  Judgement:  Poor  Insight:  Lacking  Psychomotor Activity:  Increased  Concentration:  Fair   Recall:  [employer] of Knowledge:Fair  Language: Good  Akathisia:  No  Handed:  Right  AIMS (if indicated):     Assets:  Communication Skills Physical Health Social Support  Sleep:  Number of Hours: 4.25   Musculoskeletal: Strength & Muscle Tone: within normal limits Gait & Station: normal Patient leans: N/A  Current Medications: Current Facility-Administered Medications  Medication Dose Route Frequency Provider Last Rate Last Dose  . acetaminophen (TYLENOL) tablet 650 mg  650 mg Oral Q6H PRN Kerry Hough, PA-C      . alum & mag hydroxide-simeth (MAALOX/MYLANTA) 200-200-20 MG/5ML suspension 30 mL  30 mL Oral Q4H PRN Kerry Hough, PA-C      . FLUoxetine (PROZAC) capsule 40 mg  40 mg Oral Daily Toniyah Dilmore   40 mg at 11/10/13 0817  . hydrOXYzine (ATARAX/VISTARIL) tablet 25 mg  25 mg Oral Q6H PRN Kerry Hough, PA-C      . loratadine (CLARITIN) tablet 10 mg  10 mg Oral Daily Jo-Anne Kluth   10 mg at 11/10/13 0817  . magnesium hydroxide (MILK OF MAGNESIA) suspension 30 mL  30 mL Oral Daily PRN Kerry Hough, PA-C      . neomycin-bacitracin-polymyxin (NEOSPORIN) ointment   Topical BID Fransisca Kaufmann, NP   15 application at 11/10/13 [phone]  . OLANZapine zydis (ZYPREXA) disintegrating tablet 10 mg  10 mg Oral Q8H PRN Kinnie Kaupp      . pantoprazole (PROTONIX) EC tablet 40 mg  40 mg Oral Daily Emilina Smarr   40 mg at 11/10/13 0817  . QUEtiapine (SEROQUEL) tablet 200 mg  200 mg Oral QHS Kandance Yano      . traZODone (DESYREL) tablet 100 mg  100 mg Oral QHS PRN Helem Reesor   100 mg at 11/09/13 2324    Lab Results: No results found for this or any previous visit (from the past 48 hour(s)).  Physical Findings: AIMS: Facial and Oral Movements Muscles of Facial Expression: None, normal Lips and Perioral Area: None, normal Jaw: None, normal Tongue: None, normal,Extremity Movements Upper (arms, wrists, hands, fingers): None, normal Lower (legs, knees, ankles,  toes): None, normal, Trunk Movements Neck, shoulders, hips: None, normal, Overall Severity Severity of abnormal movements (highest score from questions above): None, normal Incapacitation due to abnormal movements: None, normal Patient's awareness of abnormal movements (rate only patient's report): No Awareness, Dental Status Current problems with teeth and/or dentures?: No Does patient usually wear dentures?: No  CIWA:    COWS:     Treatment Plan Summary: Daily contact with patient to assess and evaluate symptoms and progress in treatment Medication management  Plan:  1. Continue crisis management and stabilization.  2. Medication management to reduce current symptoms to base line and improve the patient's level of functioning: -D/C Seroquel due to patient refusals -D/C Prozac  -Start Wellbutrin XL 150 mg daily for depression. -Start Latuda 40 mg daily for psychosis.  -Continue Trazodone 100mg  qhs PRN for insomnia 3. Develop treatment plan to decrease risk of relapse upon discharge of psychotic symptoms and the need for readmission.  5. Group therapy to facilitate development of healthy coping skills to use for psychosis.  6.  Health care follow up as needed for medical problems. Apply Neosporin ointment twice daily to staples along right neck and to wrist.  7. Discharge plan to include therapy to help patient cope with stressors.  8. Call for Consult with Hospitalist for additional specialty patient services as needed.   Medical Decision Making Problem Points:  Established problem, stable/improving (1), Review of last therapy session (1) and Review of psycho-social stressors (1) Data Points:  Order Aims Assessment (2) Review or order clinical lab tests (1) Review of medication regiment & side effects (2) Review of new medications or change in dosage (2)  I certify that inpatient services furnished can reasonably be expected to improve the patient's condition.   Fransisca KaufmannLaura Davis,  NP-C 11/10/2013, 1:33 PM  Patient seen, evaluated and I agree with notes by Nurse Practitioner. Thedore MinsMojeed Kynleigh Artz, MD

## 2013-11-10 NOTE — [facility] Suicide Risk Assessment (Signed)
[facility] INPATIENT:  Family/Significant Other Suicide Prevention Education  Suicide Prevention Education:  Patient Refusal for Family/Significant Other Suicide Prevention Education: The patient Peter Elliott has refused to provide written consent for family/significant other to be provided Family/Significant Other Suicide Prevention Education during admission and/or prior to discharge.  Physician notified.  Simona Huhina Intisar Claudio 11/10/2013, 3:32 PM

## 2013-11-10 NOTE — [facility] Group Notes (Signed)
[facility] Group Notes:  (Nursing/MHT/Case Management/Adjunct)  Date:  11/10/2013 Time:  2:10 PM  Type of Therapy:  Group Therapy   Participation Level:  Active  Participation Quality:  Intrusive  Affect:  Blunted  Cognitive:  Disorganized  Insight:  Lacking  Engagement in Group:  Distracting  Modes of Intervention: Discussion, Exploration and Socialization   Summary of Progress/Problems:  The topic for group was balance in life. Pt participated in the discussion about when their life was in balance and out of balance and how this feels. Pt discussed ways to get back in balance and short term goals they can work on to get where they want to be.  [name] presents as direct, hyperverbal, monopolizing, and intrusive.  He reports that he tends to be aggressive because of other people, but denied any responsibility of his actions.  Seemed to be okay with his aggressive personality.  Had minimal insight of consequences of his aggressive actions.   He denied needing help with his issues, and wants people to accept him for who he is.  There were many times when [name] interrupted other group members and tried to focus the group solely on him.  He was not easily redirectable.     Simona Huhina Yang MSW Intern  11/10/2013 2:10 PM

## 2013-11-10 NOTE — Progress Notes (Addendum)
Patient ID: Peter Elliott, male   DOB: 1986/02/15, 28 y.o.   MRN: [id number] D: patient has been verbally aggressive toward staff and patients.  Patient's roommate was moved to another room and this patient was made a "do not admit."  Patient became irate over a consent form that the SW had presented to him earlier.  He stated he signed it and wanted to see it.  Also he did not have his parents on consent to give medical information.  He has been verbally abusive stating several times, "I'm gonna bash somebody's head in.  You'll have to call the police.  I'm not afraid of guns." He had to be asked to leave the day room as he was upsetting the other patients.  He was threatening his roommate.  He had to be made a DNA.  Parents are concerned that he was trying to pull his staples out.  Patient stated, "I'm just picking at them."  Patient also got up and started banging his fist against the walls.  Informed patient that if he continued, we would take him down to the quiet room.  He refused to go.  A show of force was initiated because was close to becoming a CIRT.  After show of force, patient did agree to take 10 mg zyprexa.  He then asked the nurse to come in and give his 1700 meds which he had earlier refused.  Patient made a statement, "why didn't they let me die?  I just want to die.  I don't want to live."  He also feel Allah will heal him and he doesn't need to take medication.  Patient denies HI/AVH.  When asked if he was hearing voices he told the nurse, "if someone asks me that one more time, I'm gonna bash their head in!" He has also been verbally abusive and aggressive with his parents.  A: continue to monitor patient's behavior and medicate if necessary.  Patient was made a DNA due to threatening remarks.  R: currently, patient is visiting with his parents and he is calmer.

## 2013-11-11 MED ORDER — TRAZODONE HCL 50 MG PO TABS
50.0000 mg | ORAL_TABLET | Freq: Every evening | ORAL | Status: DC | PRN
  Administered 2013-11-11 – 2013-11-17 (×4): 50 mg via ORAL
  Filled 2013-11-11 (×2): qty 1
  Filled 2013-11-11: qty 3
  Filled 2013-11-11 (×2): qty 1

## 2013-11-11 NOTE — [facility] Group Notes (Signed)
[facility] LCSW Group Therapy  11/11/2013 11:30 AM  Type of Therapy:  Group Therapy   Participation Level:  Did not attend.    Modes of Intervention:  Discussion and Socialization   Summary of Progress/Problems:  Chaplain was here to lead a group on themes of hope and courage.   Peter Elliott   11/11/2013  11:30 AM  

## 2013-11-11 NOTE — Progress Notes (Signed)
D   Spoke briefly with pt in the dayroom early in the shift   He reported he was doing fine   He was sitting close to another male pt and talking and laughing with her  He seemed to be in control of his behavior and emotions   Pt in bed presently with eyes closed  No distress noted A    Will continue Q 15 min checks R   Pt is safe

## 2013-11-11 NOTE — Progress Notes (Signed)
Patient ID: Peter Elliott, male   DOB: [birth year], 28 y.o.   MRN: [id number] [facility] MD Progress Note  11/11/2013 11:27 AM Peter Elliott  MRN:  [id number] Subjective:" After talking to my parents, I have decided to take Wellbutrin and Latuda. Seroquel has been making me gaining a lot of weight. I also gained weight when I took Zyprexa, depakote and Abilify in the past.'' Objective: Patient is see and chart is reviewed. He has no insight into his mental illness and has been exercising poor judgment as evidenced by making threats to physically harm his roommates and the staffs. Patient continues to report that he has special power, can see vision and predict what will happen in the future. He boasts that he predicted my war outbreaks in [place] and [place] eruptions all over the world. He remains easily agitated, labile, argumentative, defiant and oppositional.  He continues to endorse suicidal thoughts but able to contract for safety. He wants his Trazodone to be decreased due to excessive sleep.  Diagnosis:   DSM5: Schizophrenia Disorders:  Delusional Disorder (297.1) Obsessive-Compulsive Disorders:   Trauma-Stressor Disorders:  Posttraumatic Stress Disorder (309.81) Substance/Addictive Disorders:   Depressive Disorders:  Labile, irritable mood Total Time spent with patient: 30 minutes  Axis I: Schizoaffective disorder bipolar type           Post traumatic disorder  Axis II: Cluster B Traits Axis III:  Past Medical History  Diagnosis Date  . Hypertension    Axis IV: other psychosocial or environmental problems, problems related to social environment and problems with primary support group  ADL's:  Intact  Sleep: Fair  Appetite:  Fair  Suicidal Ideation: yes Plan:  denies Intent:  denies Means:  denies Homicidal Ideation:  denies AEB (as evidenced by):  Psychiatric Specialty Exam: Physical Exam  Psychiatric: His affect is angry and labile. His speech is rapid and/or pressured  and tangential. He is agitated and aggressive. Thought content is paranoid and delusional. Cognition and memory are normal. He expresses impulsivity. He exhibits a depressed mood. He expresses suicidal ideation.    Review of Systems  Constitutional: Negative.   HENT: Negative.   Eyes: Negative.   Respiratory: Negative.   Cardiovascular: Negative.   Gastrointestinal: Negative.   Genitourinary: Negative.   Musculoskeletal: Negative.   Skin: Negative.   Neurological: Negative.   Endo/Heme/Allergies: Negative.   Psychiatric/Behavioral: Positive for depression and suicidal ideas. The patient is nervous/anxious and has insomnia.     Blood pressure 134/94, pulse 87, temperature 97.3 F (36.3 C), temperature source Oral, resp. rate 16, height 5' 4.5" (1.638 m), weight 103.42 kg (228 lb).Body mass index is 38.55 kg/(m^2).  General Appearance: Disheveled  Eye Contact::  Good  Speech:  Pressured  Volume:  Increased  Mood:  Angry and Irritable  Affect:  Labile and Full Range  Thought Process:  Circumstantial and Disorganized  Orientation:  Full (Time, Place, and Person)  Thought Content:  Delusions and grandiose  Suicidal Thoughts:  Yes.  without intent/plan  Homicidal Thoughts:  No  Memory:  Immediate;   Fair Recent;   Fair Remote;   Fair  Judgement:  Poor  Insight:  Lacking  Psychomotor Activity:  Increased  Concentration:  Fair  Recall:  [employer] of Knowledge:Fair  Language: Good  Akathisia:  No  Handed:  Right  AIMS (if indicated):     Assets:  Communication Skills Physical Health Social Support  Sleep:  Number of Hours: 6.75   Musculoskeletal: Strength & Muscle Tone: within  normal limits Gait & Station: normal Patient leans: N/A  Current Medications: Current Facility-Administered Medications  Medication Dose Route Frequency Provider Last Rate Last Dose  . acetaminophen (TYLENOL) tablet 650 mg  650 mg Oral Q6H PRN Spencer E Simon, PA-C      . alum & mag  hydroxide-simeth (MAALOX/MYLANTA) 200-200-20 MG/5ML suspension 30 mL  30 mL Oral Q4H PRN KKerry Hougherry HoughSpencer E Simon, PA-C      . buPROPion (WELLBUTRIN XL) 24 hr tablet 150 mg  150 mg Oral Daily Fransisca KaufmannLaura Davis, NP   150 mg at 11/11/13 0800  . hydrOXYzine (ATARAX/VISTARIL) tablet 25 mg  25 mg Oral Q6H PRN Kerry HoughSpencer E Simon, PA-C      . loratadine (CLARITIN) tablet 10 mg  10 mg Oral Daily Jahkeem Kurka   10 mg at 11/11/13 [id number]  . lurasidone (LATUDA) tablet 40 mg  40 mg Oral q1800 Fransisca KaufmannLaura Davis, NP   40 mg at 11/10/13 1938  . magnesium hydroxide (MILK OF MAGNESIA) suspension 30 mL  30 mL Oral Daily PRN Kerry HoughSpencer E Simon, PA-C      . neomycin-bacitracin-polymyxin (NEOSPORIN) ointment   Topical BID Fransisca KaufmannLaura Davis, NP   1 application at 11/11/13 0800  . OLANZapine zydis (ZYPREXA) disintegrating tablet 10 mg  10 mg Oral Q8H PRN Adelie Croswell   10 mg at 11/11/13 0823  . pantoprazole (PROTONIX) EC tablet 40 mg  40 mg Oral Daily Jalesia Loudenslager   40 mg at 11/11/13 [id number]  . traZODone (DESYREL) tablet 50 mg  50 mg Oral QHS PRN Tobiah Celestine        Lab Results: No results found for this or any previous visit (from the past 48 hour(s)).  Physical Findings: AIMS: Facial and Oral Movements Muscles of Facial Expression: None, normal Lips and Perioral Area: None, normal Jaw: None, normal Tongue: None, normal,Extremity Movements Upper (arms, wrists, hands, fingers): None, normal Lower (legs, knees, ankles, toes): None, normal, Trunk Movements Neck, shoulders, hips: None, normal, Overall Severity Severity of abnormal movements (highest score from questions above): None, normal Incapacitation due to abnormal movements: None, normal Patient's awareness of abnormal movements (rate only patient's report): No Awareness, Dental Status Current problems with teeth and/or dentures?: No Does patient usually wear dentures?: No  CIWA:    COWS:     Treatment Plan Summary: Daily contact with patient to assess and evaluate symptoms and  progress in treatment Medication management  Plan:  1. Admit for crisis management and stabilization.  2. Medication management to reduce current symptoms to base line and improve the patient's level of functioning: -Continue Seroquel 400mg  po Qhs for mood/psychosis/delusion -Continue Prozac 40mg  po daily for depression -Decrease Trazodone to 50mg  qhs PRN for insomnia 3. Develop treatment plan to decrease risk of relapse upon discharge of psychotic symptoms and the need for readmission.  5. Group therapy to facilitate development of healthy coping skills to use for psychosis.  6. Health care follow up as needed for medical problems. Apply Neosporin ointment twice daily to staples along right neck and to wrist. BMP, T3, TSH to be drawn on 11/09/13.  7. Discharge plan to include therapy to help patient cope with stressors.     Medical Decision Making Problem Points:  Established problem, worsening (2), Review of last therapy session (1) and Review of psycho-social stressors (1) Data Points:  Order Aims Assessment (2) Review or order clinical lab tests (1) Review of medication regiment & side effects (2) Review of new medications or change in dosage (2)  I  certify that inpatient services furnished can reasonably be expected to improve the patient's condition.   Thedore MinsMojeed Emric Kowalewski, MD 11/11/2013, 11:27 AM

## 2013-11-11 NOTE — Progress Notes (Signed)
[facility] Group Notes:  (Nursing/MHT/Case Management/Adjunct)  Date:  11/11/2013  Time:  8:00 p.m.   Type of Therapy:  Psychoeducational Skills  Participation Level:  Active  Participation Quality:  Appropriate  Affect:  Appropriate  Cognitive:  Appropriate  Insight:  Good  Engagement in Group:  Engaged  Modes of Intervention:  Education  Summary of Progress/Problems: The patient shared with the group this evening that he slept for much of the day due to his medication. He states that he feels less angry towards his peers and the staff. He indicated that he normally likes to remain in his room and tends to be "hateful" towards his family when they approach him. He also mentioned that he has difficulty when asked to follow a certain schedule/routine which he attributes to having Asperger's Syndrome. The patient also shared that he had a good visit with his father. The patient's goal for tomorrow is to work on his anger and to attend the groups.   Westly PamBenjamin S Harlo Jaso 11/11/2013, 9:29 PM

## 2013-11-11 NOTE — [facility] Group Notes (Signed)
[facility]   11/11/2013 10:29 AM  Participation Quality:  Did not attend.  Pt was sleeping.    Peter Elliott

## 2013-11-11 NOTE — Progress Notes (Signed)
LCSW has left patient mother a message regarding information she has requested MD and SW to fill out for her job.  At this time patient's mother lives in another state, is not a direct caregiver and paperwork cannot be filled out.  LCSW explained in message that mother should follow up with patient's PCP or outpatient provider in efforts to get information completed.  Paperwork will be given back to mother on her request or with patient when he is discharged.    Ashley JacobsHannah Nail, MSW, LCSW Clinical Lead [phone]

## 2013-11-12 MED ORDER — LURASIDONE HCL 40 MG PO TABS
60.0000 mg | ORAL_TABLET | Freq: Every day | ORAL | Status: DC
  Administered 2013-11-12 – 2013-11-15 (×4): 60 mg via ORAL
  Filled 2013-11-12 (×6): qty 2

## 2013-11-12 MED ORDER — POLYVINYL ALCOHOL 1.4 % OP SOLN
1.0000 [drp] | OPHTHALMIC | Status: DC | PRN
  Administered 2013-11-14 – 2013-11-15 (×2): 1 [drp] via OPHTHALMIC
  Filled 2013-11-12: qty 15

## 2013-11-12 NOTE — Progress Notes (Signed)
[facility] Group Notes:  (Nursing/MHT/Case Management/Adjunct)  Date:  11/12/2013  Time:  8:00 p.m.   Type of Therapy:  Psychoeducational Skills  Participation Level:  Active  Participation Quality:  Appropriate  Affect:  Appropriate  Cognitive:  Appropriate  Insight:  Improving  Engagement in Group:  Improving  Modes of Intervention:  Education  Summary of Progress/Problems: The patient shared in group that he had a good day overall. He stated that he achieved his goal which was to not get angry. He also stated that he had a good visit with his sister today. The patient was unable to identify why he didn't become angry today. As for the theme for the day, his support system will consist of his faith and using laughter to cope with his situation.   Westly PamBenjamin S Viraat Vanpatten 11/12/2013, 9:03 PM

## 2013-11-12 NOTE — Progress Notes (Signed)
Patient ID: Peter Elliott, male   DOB: Mar 11, 1986, 28 y.o.   MRN: [id number] D. The patient shared that he was diagnosed with Asperger's when he was 28 years old. Stated that he told his parents that he wasn't going to let that stop him from achieving great things. He reports that he is, "hateful towards my parents and most people" and that everything makes him angry. Stated that he prefers to isolate in his room. 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 Claims that he usually writes prose vs. actual poetry because his poetry tends to be very lengthy.  It was difficult to ascertain if he was being grandiose or if he actually writes poetry because he was not willing to share what he wrote in his journal.  A. Met with patient to assess. Verbal support provided. Encouraged to attend evening group. Reviewed and administered medications. Provided patient with a journal to write in. R. The patient attended and participated in evening group. Interacted appropriately in the milieu. Denied any suicidal thoughts. Compliant with medication.

## 2013-11-12 NOTE — Progress Notes (Signed)
Patient ID: Peter Elliott, male   DOB: 05/02/1986, 28 y.o.   MRN: [id number] D. Patient presents with hypomanic mood, tangential pressured speech. Peter Elliott reports '' I'm doing well, I am over this what I did (gesturing to his neck from suicide attempt ) and I'm not going to do that anymore. What I need to work on is learning to not be impulsive and to work out my anger instead of blowing up , because that's what I used to do , especially with my parents, and not communicating and you really do better if you communicate and talk about things. I know that, especially from when I was in therapy. And when I was in therapy the therapist told me to put things in like a safe mentally and take them out one at a time that's what I need to do . But I'm doing okay, see everyone likes my [place] shirt! '' Pt has been cooperative but continues to need redirection at times. He denies any SI/HI/A/V Hallucinations. A. Medications given . Support and encouragement provided.Patient encouraged to discuss with staff concerns instead of lashing out as noted in prior shifts on the unit. He verbalized understanding. Discussed above information with Dr. Tawni CarnesSaranga. R . Pt in no acute distress at this time, he has been more visible on the unit and engaging with peers. Will continue to monitor q 15 minutes for safety.

## 2013-11-12 NOTE — Progress Notes (Signed)
Patient ID: Peter Elliott, male   DOB: [birth year], 28 y.o.   MRN: [id number] Patient ID: Peter Elliott, male   DOB: [birth year], [age] y.o.   MRN: [id number] [facility] Progress Note  11/12/2013 4:41 PM Peter Muslimicholas Kamara  MRN:  [id number]  Subjective: [name] reports, "I attempted suicide, that is why I'm here. Here are the cuts I made on my neck. I was told the wounds are healing remarkably well. I have to tell you, no one heals so fast like me"   Objective: Patient is see and chart is reviewed. He has no insight into his mental illness. He says he is sleeping well, participating in groups sessions. He adds that he is currently relying on his faith to get better. The staff reports that patient seem more manic today. The self inflicted lacerations to neck area with staples intact. No signs of infection.   Diagnosis:   DSM5: Schizophrenia Disorders:  Delusional Disorder (297.1) Obsessive-Compulsive Disorders:   Trauma-Stressor Disorders:  Posttraumatic Stress Disorder (309.81) Substance/Addictive Disorders:   Depressive Disorders:  Labile, irritable mood Total Time spent with patient: 30 minutes  Axis I: Schizoaffective disorder bipolar type           Post traumatic disorder  Axis II: Cluster B Traits Axis III:  Past Medical History  Diagnosis Date  . Hypertension    Axis IV: other psychosocial or environmental problems, problems related to social environment and problems with primary support group  ADL's:  Intact  Sleep: Fair  Appetite:  Fair  Suicidal Ideation: yes Plan:  denies Intent:  denies Means:  denies Homicidal Ideation:  denies AEB (as evidenced by):  Psychiatric Specialty Exam: Physical Exam  Psychiatric: His affect is angry and labile. His speech is rapid and/or pressured and tangential. He is agitated and aggressive. Thought content is paranoid and delusional. Cognition and memory are normal. He expresses impulsivity. He exhibits a depressed mood. He expresses suicidal  ideation.    Review of Systems  Constitutional: Negative.   HENT: Negative.   Eyes: Negative.   Respiratory: Negative.   Cardiovascular: Negative.   Gastrointestinal: Negative.   Genitourinary: Negative.   Musculoskeletal: Negative.   Skin: Negative.   Neurological: Negative.   Endo/Heme/Allergies: Negative.   Psychiatric/Behavioral: Positive for depression and suicidal ideas. The patient is nervous/anxious and has insomnia.     Blood pressure 165/95, pulse 103, temperature 97.3 F (36.3 C), temperature source Oral, resp. rate 18, height 5' 4.5" (1.638 m), weight 103.42 kg (228 lb).Body mass index is 38.55 kg/(m^2).  General Appearance: Disheveled  Eye Contact::  Good  Speech:  Pressured  Volume:  Increased  Mood:  Angry and Irritable  Affect:  Labile and Full Range  Thought Process:  Circumstantial and Disorganized  Orientation:  Full (Time, Place, and Person)  Thought Content:  Delusions and grandiose  Suicidal Thoughts:  Yes.  without intent/plan  Homicidal Thoughts:  No  Memory:  Immediate;   Fair Recent;   Fair Remote;   Fair  Judgement:  Poor  Insight:  Lacking  Psychomotor Activity:  Increased  Concentration:  Fair  Recall:  [employer]  Fund of Knowledge:Fair  Language: Good  Akathisia:  No  Handed:  Right  AIMS (if indicated):     Assets:  Communication Skills Physical Health Social Support  Sleep:  Number of Hours: 6.25   Musculoskeletal: Strength & Muscle Tone: within normal limits Gait & Station: normal Patient leans: N/A  Current Medications: Current Facility-Administered Medications  Medication Dose Route Frequency Provider  Last Rate Last Dose  . acetaminophen (TYLENOL) tablet 650 mg  650 mg Oral Q6H PRN Kerry HoughSpencer E Simon, PA-C      . alum & mag hydroxide-simeth (MAALOX/MYLANTA) 200-200-20 MG/5ML suspension 30 mL  30 mL Oral Q4H PRN Kerry HoughSpencer E Simon, PA-C      . buPROPion (WELLBUTRIN XL) 24 hr tablet 150 mg  150 mg Oral Daily Fransisca KaufmannLaura Davis, NP   150 mg at  11/12/13 0747  . hydrOXYzine (ATARAX/VISTARIL) tablet 25 mg  25 mg Oral Q6H PRN Kerry HoughSpencer E Simon, PA-C      . loratadine (CLARITIN) tablet 10 mg  10 mg Oral Daily Mojeed Akintayo   10 mg at 11/12/13 0747  . lurasidone (LATUDA) tablet 40 mg  40 mg Oral q1800 Fransisca KaufmannLaura Davis, NP   40 mg at 11/11/13 1711  . magnesium hydroxide (MILK OF MAGNESIA) suspension 30 mL  30 mL Oral Daily PRN Kerry HoughSpencer E Simon, PA-C      . neomycin-bacitracin-polymyxin (NEOSPORIN) ointment   Topical BID Fransisca KaufmannLaura Davis, NP   15 application at 11/12/13 [id number]  . OLANZapine zydis (ZYPREXA) disintegrating tablet 10 mg  10 mg Oral Q8H PRN Mojeed Akintayo   10 mg at 11/11/13 0823  . pantoprazole (PROTONIX) EC tablet 40 mg  40 mg Oral Daily Mojeed Akintayo   40 mg at 11/12/13 0747  . polyvinyl alcohol (LIQUIFILM TEARS) 1.4 % ophthalmic solution 1 drop  1 drop Both Eyes PRN Caprice KluverVinay P Saranga, MD      . traZODone (DESYREL) tablet 50 mg  50 mg Oral QHS PRN Mojeed Akintayo   50 mg at 11/11/13 2247    Lab Results: No results found for this or any previous visit (from the past 48 hour(s)).  Physical Findings: AIMS: Facial and Oral Movements Muscles of Facial Expression: None, normal Lips and Perioral Area: None, normal Jaw: None, normal Tongue: None, normal,Extremity Movements Upper (arms, wrists, hands, fingers): None, normal Lower (legs, knees, ankles, toes): None, normal, Trunk Movements Neck, shoulders, hips: None, normal, Overall Severity Severity of abnormal movements (highest score from questions above): None, normal Incapacitation due to abnormal movements: None, normal Patient's awareness of abnormal movements (rate only patient's report): No Awareness, Dental Status Current problems with teeth and/or dentures?: No Does patient usually wear dentures?: No  CIWA:    COWS:     Treatment Plan Summary: Daily contact with patient to assess and evaluate symptoms and progress in treatment Medication management  Plan:  1. Admit for  crisis management and stabilization.  2. Medication management to reduce current symptoms to base line and improve the patient's level of functioning: -Increased Latuda from 40 to 60 mg daily for mood control,  Continue Zyprexa zydis prn for agitations, Wellbutrin XL 150 mg for depression, Vistaril 25 mg prn for anxiety and Trazodone 50 mg for sleep 3. Develop treatment plan to decrease risk of relapse upon discharge of psychotic symptoms and the need for readmission.  5. Group therapy to facilitate development of healthy coping skills to use for psychosis.  6. Health care follow up as needed for medical problems. Apply Neosporin ointment twice daily to staples along right neck and to wrist.   7. Discharge plan to include therapy to help patient cope with stressors.    Medical Decision Making Problem Points:  Established problem, worsening (2), Review of last therapy session (1) and Review of psycho-social stressors (1) Data Points:  Order Aims Assessment (2) Review or order clinical lab tests (1) Review of medication regiment &  side effects (2) Review of new medications or change in dosage (2)  I certify that inpatient services furnished can reasonably be expected to improve the patient's condition.   Nicole Kindred I Monifa Blanchette,PMHNp-BC 11/12/2013, 4:41 PM

## 2013-11-12 NOTE — Progress Notes (Signed)
Patient ID: Peter Elliott, male   DOB: June 28, 1986, 28 y.o.   MRN: [id number] Psychoeducational Group Note  Date:  11/12/2013 Time:0900am  Group Topic/Focus:  Identifying Needs:   The focus of this group is to help patients identify their personal needs that have been historically problematic and identify healthy behaviors to address their needs.  Participation Level:  Active  Participation Quality:  Appropriate  Affect:  Appropriate  Cognitive:  Appropriate  Insight:  Supportive  Engagement in Group:  Supportive  Additional Comments:  Inventory group   [name], Jina Olenick Brooks 11/12/2013,12:21 PM

## 2013-11-12 NOTE — Progress Notes (Signed)
Patient ID: Peter Elliott, male   DOB: 25-Dec-1985, 28 y.o.   MRN: [id number] Late Entry. D. Patient presents with irritable mood, affect labile. Pt speech remains tangential. Janyth PupaNicholas reported increased agitation in am, prn medications offered and he accepted. Pt isolative to room throughout most of shift. A. Medications given as ordered. Support and encouragement provided. R. Patient in no acute distress. Will continue to monitor  q15 minutes for safety.

## 2013-11-12 NOTE — [facility] Group Notes (Signed)
[facility] LCSW Group Therapy  11/12/2013 12:50 PM  Type of Therapy:  Group Therapy  Participation Level:  Minimal  Participation Quality:  Attentive and Redirectable  Affect:  Blunted and Depressed  Cognitive:  Alert  Insight:  Limited  Engagement in Therapy:  Lacking  Modes of Intervention:  Discussion, Exploration, Problem-solving, Rapport Building, Socialization and Support  Summary of Progress/Problems: The main focus of today's process group was to identify the patient's current support system and decide on other supports that can be put in place. An emphasis was placed on using counselor, doctor, therapy groups, 12-step groups, and problem-specific support groups to expand supports, as well as doing something different than has been done before.   [name] exhibited difficulty with reflecting upon his social support system AEB providing the response of "Don't live in the past. The future changes just as the weather does" without providing clarification to his statement or what thought triggered his response. [name] was able to identify his sister and mother to be positive supports for him as he identified them to be great listeners and advice givers. Patient was unable to process how he would utilize his supports in moments of depression or psychosis.   Haskel KhanGregory C Pickett Jr. 11/12/2013, 12:50 PM

## 2013-11-12 NOTE — Progress Notes (Signed)
Patient ID: Peter Elliott, male   DOB: [birth year], 28 y.o.   MRN: [id number] Psychoeducational Group Note  Date:  11/12/2013 Time:0920am  Group Topic/Focus:  Identifying Needs:   The focus of this group is to help patients identify their personal needs that have been historically problematic and identify healthy behaviors to address their needs.  Participation Level:  Active  Participation Quality:  Appropriate  Affect:  Appropriate  Cognitive:  Appropriate  Insight:  Supportive  Engagement in Group:  Supportive  Additional Comments:  Healthy coping skills.   [name], Rayshell Goecke Brooks 11/12/2013,12:22 PM

## 2013-11-12 NOTE — Progress Notes (Signed)
Patient ID: Peter Elliott, male   DOB: May 22, 1986, 28 y.o.   MRN: [id number] D. The patient had a bright mood and affect this evening. He was in somewhat of a euphoric hypomanic state. Stated he had a great day today because his sister visited him. He also shared that he was very proud of himself for not getting angry at anyone today. Stated that people in general tend to aggravate him, but for some reason he could not identify that did not happen today. He became somewhat grandiose as he elaborated that he helped put everyone else in a great mood today due to his contagious laughter. "When I laugh everyone can't help but laugh and be in a great mood too. I have that gift."  A. Encouraged the patient to attend evening group. Met with patient to assess. R. The patient attended and participated in evening wrap up group. Denied suicidal/homicidal ideation. "I'm over that." Denied any a/v hallucinations.

## 2013-11-13 DIAGNOSIS — F319 Bipolar disorder, unspecified: Secondary | ICD-10-CM

## 2013-11-13 DIAGNOSIS — F4310 Post-traumatic stress disorder, unspecified: Secondary | ICD-10-CM

## 2013-11-13 DIAGNOSIS — F259 Schizoaffective disorder, unspecified: Principal | ICD-10-CM

## 2013-11-13 NOTE — [facility] Group Notes (Signed)
[facility] Group Notes:  (Nursing/MHT/Case Management/Adjunct)  Date:  11/13/2013  Time:  11:01 AM  Type of Therapy:  Psychoeducational Skills--Self Inventory Review with RN  Participation Level:  Active  Participation Quality:  Monopolizing  Affect:  Irritable and Labile  Cognitive:  Lacking  Insight:  Lacking  Engagement in Group:  Distracting, Monopolizing and Off Topic  Modes of Intervention:  Discussion, Education and Exploration  Summary of Progress/Problems:  Malva LimesLinsey Charisse Wendell 11/13/2013, 11:01 AM

## 2013-11-13 NOTE — [facility] Group Notes (Signed)
[facility] LCSW Group Therapy  11/13/2013 11:15 AM  Type of Therapy:  Group Therapy  Participation Level:  Active  Participation Quality:  Attentive and Monopolizing  Affect:  Labile  Cognitive:  Alert and Oriented  Insight:  Developing/Improving  Engagement in Therapy:  Limited  Modes of Intervention:  Limit-setting, Socialization and Support  Summary of Progress/Problems: Group focus today was on self care. There was discussion on what individual group members saw as self care and patient were then given opportunity to identify an area they could focus on upon discharge. Peter Elliott shared importance for him of having a place of his own which he hopes to arrange for upon discharge. Patient spoke with flat pressured speech.   Peter Elliott

## 2013-11-13 NOTE — Progress Notes (Signed)
Adult Psychoeducational Group Note  Date:  11/13/2013 Time:  4:16 PM  Group Topic/Focus:  Managing Feelings:   The focus of this group is to identify what feelings patients have difficulty handling and develop a plan to handle them in a healthier way upon discharge.  Participation Level:  Active  Participation Quality:  Sharing  Affect:  Appropriate  Cognitive:  Alert and Appropriate  Insight: Appropriate  Engagement in Group:  Engaged  Modes of Intervention:  Education and Support  Additional Comments:  PT was in group   Khadeeja Elden R Aashir Umholtz 11/13/2013, 4:16 PM

## 2013-11-13 NOTE — Progress Notes (Signed)
Adult Psychoeducational Group Note  Date:  11/13/2013 Time:  9:34 PM  Group Topic/Focus:  Wrap-Up Group:   The focus of this group is to help patients review their daily goal of treatment and discuss progress on daily workbooks.  Participation Level:  Minimal  Participation Quality:  Inattentive  Affect:  Lethargic  Cognitive:  Appropriate  Insight: Appropriate  Engagement in Group:  None  Modes of Intervention:  Discussion  Additional Comments:The patient expressed that he learn from group that all people have problems.The patient also said that group help him realize that first step is a change.  Laneta Simmersrthur L Bricelyn Freestone 11/13/2013, 9:34 PM

## 2013-11-13 NOTE — [facility] Group Notes (Signed)
[facility] Group Notes:  (Nursing/MHT/Case Management/Adjunct)  Date:  11/13/2013  Time:  11:02 AM  Type of Therapy:  Psychoeducational Skills--Healthy Support Systems with RN  Participation Level:  Active  Participation Quality:  Monopolizing and Redirectable  Affect:  Irritable and Labile  Cognitive:  Lacking  Insight:  Lacking  Engagement in Group: monopolizing  Modes of Intervention:  Discussion, Education and Exploration  Summary of Progress/Problems:Focus of this group was on healthy support systems, assisting patients in identifying prior unhealthy supports, to replace with healthy ones.    Malva LimesLinsey Naesha Buckalew 11/13/2013, 11:02 AM

## 2013-11-13 NOTE — Progress Notes (Signed)
Patient ID: Peter Elliott, male   DOB: [birth year], 28 y.o.   MRN: [id number] Patient ID: Peter Elliott, male   DOB: [birth year], 28 y.o.   [id number] [facility]  11/13/2013 1:30 PM Peter Elliott  MRN:  [id number]  Subjective: Pt continues to be irritable on the unit. Sleep/appetite good. Pt reports that he does not want to go back to live with his dad.   Objective: Patient is see and chart is reviewed. He has no insight into his mental illness. He says he is sleeping well, participating in groups sessions. He adds that he is currently relying on his faith to get better. The staff reports that patient continues to be irritable on the unit. The self inflicted lacerations to neck area with staples intact. No signs of infection.   Diagnosis:   DSM5: Schizophrenia Disorders:  Delusional Disorder (297.1) Obsessive-Compulsive Disorders:   Trauma-Stressor Disorders:  Posttraumatic Stress Disorder (309.81) Substance/Addictive Disorders:   Depressive Disorders:  Labile, irritable mood Total Time spent with patient: 15 minutes  Axis I: Schizoaffective disorder bipolar type           Post traumatic disorder  Axis II: Cluster B Traits Axis III:  Past Medical History  Diagnosis Date  . Hypertension    Axis IV: other psychosocial or environmental problems, problems related to social environment and problems with primary support group  ADL's:  Intact  Sleep: Fair  Appetite:  Fair  Suicidal Ideation: yes Plan:  denies Intent:  denies Means:  denies Homicidal Ideation:  denies AEB (as evidenced by):  Psychiatric Specialty Exam: Physical Exam  Psychiatric: His affect is angry and labile. His speech is rapid and/or pressured and tangential. He is agitated and aggressive. Thought content is paranoid and delusional. Cognition and memory are normal. He expresses impulsivity. He exhibits a depressed mood. He expresses suicidal ideation.    Review of Systems  Constitutional:  Negative.   HENT: Negative.   Eyes: Negative.   Respiratory: Negative.   Cardiovascular: Negative.   Gastrointestinal: Negative.   Genitourinary: Negative.   Musculoskeletal: Negative.   Skin: Negative.   Neurological: Negative.   Endo/Heme/Allergies: Negative.   Psychiatric/Behavioral: Positive for depression and suicidal ideas. The patient is nervous/anxious and has insomnia.     Blood pressure 145/95, pulse 100, temperature 98.1 F (36.7 C), temperature source Oral, resp. rate 16, height 5' 4.5" (1.638 m), weight 103.42 kg (228 lb).Body mass index is 38.55 kg/(m^2).  General Appearance: Disheveled  Eye Contact::  Good  Speech:  Pressured  Volume:  Increased  Mood:  Angry and Irritable  Affect:  Labile and Full Range  Thought Process:  Circumstantial and Disorganized  Orientation:  Full (Time, Place, and Person)  Thought Content:  Delusions and grandiose  Suicidal Thoughts:  No.  Homicidal Thoughts:  No  Memory:  Immediate;   Fair Recent;   Fair Remote;   Fair  Judgement:  Poor  Insight:  Lacking  Psychomotor Activity:  Increased  Concentration:  Fair  Recall:  [employer]Fair  Fund of Knowledge:Fair  Language: Good  Akathisia:  No  Handed:  Right  AIMS (if indicated):     Assets:  Communication Skills Physical Health Social Support  Sleep:  Number of Hours: 6.75   Musculoskeletal: Strength & Muscle Tone: within normal limits Gait & Station: normal Patient leans: N/A  Current Medications: Current Facility-Administered Medications  Medication Dose Route Frequency Provider Last Rate Last Dose  . acetaminophen (TYLENOL) tablet 650 mg  650 mg Oral  Q6H PRN Kerry HoughSpencer E Simon, PA-C      . alum & mag hydroxide-simeth (MAALOX/MYLANTA) 200-200-20 MG/5ML suspension 30 mL  30 mL Oral Q4H PRN Kerry HoughSpencer E Simon, PA-C      . hydrOXYzine (ATARAX/VISTARIL) tablet 25 mg  25 mg Oral Q6H PRN Kerry HoughSpencer E Simon, PA-C      . loratadine (CLARITIN) tablet 10 mg  10 mg Oral Daily Mojeed Akintayo   10  mg at 11/13/13 0756  . lurasidone (LATUDA) tablet 60 mg  60 mg Oral q1800 Sanjuana KavaAgnes I Nwoko, NP   60 mg at 11/12/13 1704  . magnesium hydroxide (MILK OF MAGNESIA) suspension 30 mL  30 mL Oral Daily PRN Kerry HoughSpencer E Simon, PA-C      . neomycin-bacitracin-polymyxin (NEOSPORIN) ointment   Topical BID Fransisca KaufmannLaura Davis, NP      . OLANZapine zydis (ZYPREXA) disintegrating tablet 10 mg  10 mg Oral Q8H PRN Mojeed Akintayo   10 mg at 11/11/13 0823  . pantoprazole (PROTONIX) EC tablet 40 mg  40 mg Oral Daily Mojeed Akintayo   40 mg at 11/13/13 0756  . polyvinyl alcohol (LIQUIFILM TEARS) 1.4 % ophthalmic solution 1 drop  1 drop Both Eyes PRN Caprice KluverVinay P Zenya Hickam, MD      . traZODone (DESYREL) tablet 50 mg  50 mg Oral QHS PRN Mojeed Akintayo   50 mg at 11/11/13 2247    Lab Results: No results found for this or any previous visit (from the past 48 hour(s)).  Physical Findings: AIMS: Facial and Oral Movements Muscles of Facial Expression: None, normal Lips and Perioral Area: None, normal Jaw: None, normal Tongue: None, normal,Extremity Movements Upper (arms, wrists, hands, fingers): None, normal Lower (legs, knees, ankles, toes): None, normal, Trunk Movements Neck, shoulders, hips: None, normal, Overall Severity Severity of abnormal movements (highest score from questions above): None, normal Incapacitation due to abnormal movements: None, normal Patient's awareness of abnormal movements (rate only patient's report): No Awareness, Dental Status Current problems with teeth and/or dentures?: No Does patient usually wear dentures?: No  CIWA:    COWS:     Treatment Plan Summary: Daily contact with patient to assess and evaluate symptoms and progress in treatment Medication management  Plan:  1. Admit for crisis management and stabilization.  2. Medication management to reduce current symptoms to base line and improve the patient's level of functioning: -Increased Latuda from 40 to 60 mg daily for mood control  yesterday,  Continue Zyprexa zydis prn for agitations,, Vistaril 25 mg prn for anxiety and Trazodone 50 mg for sleep. D/c wellbutrin, since may be worsening irritability/mood lability. 3. Develop treatment plan to decrease risk of relapse upon discharge of psychotic symptoms and the need for readmission.  5. Group therapy to facilitate development of healthy coping skills to use for psychosis.  6. Health care follow up as needed for medical problems. Apply Neosporin ointment twice daily to staples along right neck and to wrist.   7. Discharge plan to include therapy to help patient cope with stressors.    Medical Decision Making Problem Points:  Established problem, worsening (2), Review of last therapy session (1) and Review of psycho-social stressors (1) Data Points:  Order Aims Assessment (2) Review or order clinical lab tests (1) Review of medication regiment & side effects (2) Review of new medications or change in dosage (2)  I certify that inpatient services furnished can reasonably be expected to improve the patient's condition.   Caprice KluverVinay P Geselle Cardosa, MD  11/13/2013, 1:30 PM

## 2013-11-13 NOTE — Progress Notes (Signed)
Patient ID: Peter Elliott, male   DOB: 1985/09/20, 28 y.o.   MRN: [id number] D. Patient presents with irritable mood today, affect is very labile and speech remains tangential and pressured.  [name] in early am with hypomanic mood stating '' OH I'm doing great, I'm fabulous, my sister came to visit me last night, she came the last time I was in the hospital. Oh yeah I slept like a log '' Later in group pt visible irritable and agitated stating '' I just hate being around people. I don't want to be around anybody or live with anybody, especially my parents, I've lived with my dad way too many times and that doesn't work. I just like having my own space. And I don't like to go outside, you see I'm agoraphobic and it takes a lot out of me to go outside. I'm just irritable today, cranky, agitated so I'm just trying to deal with that '' Pt has been cooperative but continues to need redirection at times. He denies any SI/HI/A/V Hallucinations. A. Medications given . Support and encouragement provided.Patient offered prn medications for agitation and he declines.. Discussed above information with Dr. Tawni CarnesSaranga. R . Pt in no acute distress at this time, he has been more visible on the unit and engaging with peers. Will continue to monitor q 15 minutes for safety.

## 2013-11-14 MED ORDER — OLANZAPINE 5 MG PO TBDP
5.0000 mg | ORAL_TABLET | Freq: Three times a day (TID) | ORAL | Status: DC | PRN
  Administered 2013-11-19: 5 mg via ORAL
  Filled 2013-11-14 (×3): qty 1

## 2013-11-14 NOTE — [facility] Group Notes (Signed)
[facility] LCSW Group Therapy  11/14/2013 1:15 pm  Type of Therapy: Process Group Therapy  Participation Level:  Active  Participation Quality:  Appropriate  Affect:  Flat  Cognitive:  Oriented  Insight:  Improving  Engagement in Group:  Limited  Engagement in Therapy:  Limited  Modes of Intervention:  Activity, Clarification, Education, Problem-solving and Support  Summary of Progress/Problems: Today's group addressed the issue of overcoming obstacles.  Patients were asked to identify their biggest obstacle post d/c that stands in the way of their on-going success, and then problem solve as to how to manage this.  As was evidenced this AM in group, Peter Elliott has a lot to say, but very little in the way of information about his own situation.  He did talk about the fact that his father has made him save money, and that has helped immensely.  At the same time, he also stated that he does not want to return home, and even if he cannot get into a group home from here, he would rather go to a shelter than return there.  Limited insight.  Hard to imagine him on his own.  He denfinately needs structure.  Daryel Geraldorth, Mickel Schreur B 11/14/2013   3:04 PM

## 2013-11-14 NOTE — [facility] Group Notes (Signed)
[facility]   11/14/2013 10:53 AM  Participation Quality:  Engaged  Mood/Affect:  Excited  Depression Rating:  Denies  Anxiety Rating:  4  Thoughts of Suicide:  No Will you contract for safety?   NA  Current AVH:  No  Plan for Discharge/Comments:  [name] is quite animated.  He jumps from subject to subject, almost like a flow of consciousness.  Came in and out of group several times.  Says he is here to get meds adjusted.  Reluctantly admits to attempting to kill himself by slicing his neck.  "I severed 5 arteries."  His mother is here from [place] to be with him since he was admitted.  Stays with father, but thinks it might be a good idea to live in ALF.  Told him we could get the process started, but likely not get him there from here.  He accepted that.    Transportation Means: family  Supports: family  Ida RogueRodney B Keaja Reaume

## 2013-11-14 NOTE — Progress Notes (Signed)
NSG shift assessment. 7a-7p.  D: Affect continues to be bright and pt is cooperative. Talked about his religious beliefs and said that while his family follows "Jehova" and he follows Allah. He rapidly said that actually he also worships the ancient gods and that Allah speaks about them in the [place]. He defined himself as being eclectic.  He said that he has PTSD from "issues in the past" and feels that he is stuck in an acute stage. He did not elaborate on what the issues were. Would like to go and live in an assisted living facility eventually so that he can be sure to get his medications appropriately. States that he sometimes forgets and has to leave himself notes all over the place. Pleased that Zyprexa helps his anger.  Attends groups and participates. When he goes home he wants to have his dad more involved in his therapy. Cooperative with staff and is getting along well with peers.  A: Observed pt interacting in group and in the milieu: Support and encouragement offered. Safety maintained with observations every 15 minutes.  R:  Contracts for safety and continues to follow the treatment plan, working on learning new coping skills.

## 2013-11-14 NOTE — Progress Notes (Signed)
Patient ID: Peter Elliott, male   DOB: 19-Oct-1985, 28 y.o.   MRN: [id number] [facility] MD Progress Note  11/14/2013 10:08 AM Peter Elliott  MRN:  [id number]  Subjective: "I am still feeling irritable and agitated from time to time but overall I think I am doing better than last week.''   Objective: Patient is see and chart is reviewed. Patient appears to have minimal insight into his mental illness, his behavior has been unpredictable. He remains easily agitated, labile, argumentative and often defiant and oppositional.  He continues to endorse suicidal thoughts but able to contract for safety. Patient does not want to return home to his father upon discharge, he is requesting to be discharged to an assisted living environment where he believes he can have more freedom. He has started to participates in groups but needs constant redirections. He is now compliant with his medications and has not verbalized any adverse reactions. The self inflicted lacerations to his neck area with staples is intact. There is no signs of infection.   Diagnosis:   DSM5: Schizophrenia Disorders:  Delusional Disorder (297.1) Obsessive-Compulsive Disorders:   Trauma-Stressor Disorders:  Posttraumatic Stress Disorder (309.81) Substance/Addictive Disorders:   Depressive Disorders:  Labile, irritable mood Total Time spent with patient: 20 minutes  Axis I: Schizoaffective disorder bipolar type           Post traumatic disorder  Axis II: Cluster B Traits Axis III:  Past Medical History  Diagnosis Date  . Hypertension    Axis IV: other psychosocial or environmental problems, problems related to social environment and problems with primary support group  ADL's:  Intact  Sleep: Fair  Appetite:  Fair  Suicidal Ideation: yes Plan:  denies Intent:  denies Means:  denies Homicidal Ideation:  denies AEB (as evidenced by):  Psychiatric Specialty Exam: Physical Exam  Psychiatric: His affect is angry and labile. His  speech is rapid and/or pressured and tangential. He is agitated and aggressive. Thought content is paranoid and delusional. Cognition and memory are normal. He expresses impulsivity. He exhibits a depressed mood. He expresses suicidal ideation.    Review of Systems  Constitutional: Negative.   HENT: Negative.   Eyes: Negative.   Respiratory: Negative.   Cardiovascular: Negative.   Gastrointestinal: Negative.   Genitourinary: Negative.   Musculoskeletal: Negative.   Skin: Negative.   Neurological: Negative.   Endo/Heme/Allergies: Negative.   Psychiatric/Behavioral: Positive for depression and suicidal ideas. The patient is nervous/anxious and has insomnia.     Blood pressure 162/104, pulse 71, temperature 98.1 F (36.7 C), temperature source Oral, resp. rate 20, height 5' 4.5" (1.638 m), weight 103.42 kg (228 lb).Body mass index is 38.55 kg/(m^2).  General Appearance: fairly groomed  Patent attorney::  Good  Speech:  Pressured  Volume:  Increased  Mood:  Angry and Irritable  Affect:  Labile and Full Range  Thought Process:  Circumstantial and Disorganized  Orientation:  Full (Time, Place, and Person)  Thought Content:  Delusions and grandiose  Suicidal Thoughts:  yes  Homicidal Thoughts:  No  Memory:  Immediate;   Fair Recent;   Fair Remote;   Fair  Judgement:  Poor  Insight:  Lacking  Psychomotor Activity:  Increased  Concentration:  Fair  Recall:  [employer] of Knowledge:Fair  Language: Good  Akathisia:  No  Handed:  Right  AIMS (if indicated):     Assets:  Communication Skills Physical Health Social Support  Sleep:  Number of Hours: 6.75   Musculoskeletal: Strength &  Muscle Tone: within normal limits Gait & Station: normal Patient leans: N/A  Current Medications: Current Facility-Administered Medications  Medication Dose Route Frequency Provider Last Rate Last Dose  . acetaminophen (TYLENOL) tablet 650 mg  650 mg Oral Q6H PRN Kerry HoughSpencer E Simon, PA-C      . alum &  mag hydroxide-simeth (MAALOX/MYLANTA) 200-200-20 MG/5ML suspension 30 mL  30 mL Oral Q4H PRN Kerry HoughSpencer E Simon, PA-C      . hydrOXYzine (ATARAX/VISTARIL) tablet 25 mg  25 mg Oral Q6H PRN Kerry HoughSpencer E Simon, PA-C      . loratadine (CLARITIN) tablet 10 mg  10 mg Oral Daily Lashun Ramseyer   10 mg at 11/14/13 [id number]  . lurasidone (LATUDA) tablet 60 mg  60 mg Oral q1800 Sanjuana KavaAgnes I Nwoko, NP   60 mg at 11/13/13 1711  . magnesium hydroxide (MILK OF MAGNESIA) suspension 30 mL  30 mL Oral Daily PRN Kerry HoughSpencer E Simon, PA-C      . neomycin-bacitracin-polymyxin (NEOSPORIN) ointment   Topical BID Fransisca KaufmannLaura Davis, NP   15 application at 11/14/13 [phone]  . OLANZapine zydis (ZYPREXA) disintegrating tablet 5 mg  5 mg Oral Q8H PRN Jimmey Hengel      . pantoprazole (PROTONIX) EC tablet 40 mg  40 mg Oral Daily Lonnie Reth   40 mg at 11/14/13 0830  . polyvinyl alcohol (LIQUIFILM TEARS) 1.4 % ophthalmic solution 1 drop  1 drop Both Eyes PRN Caprice KluverVinay P Saranga, MD      . traZODone (DESYREL) tablet 50 mg  50 mg Oral QHS PRN Kariann Wecker   50 mg at 11/11/13 2247    Lab Results: No results found for this or any previous visit (from the past 48 hour(s)).  Physical Findings: AIMS: Facial and Oral Movements Muscles of Facial Expression: None, normal Lips and Perioral Area: None, normal Jaw: None, normal Tongue: None, normal,Extremity Movements Upper (arms, wrists, hands, fingers): None, normal Lower (legs, knees, ankles, toes): None, normal, Trunk Movements Neck, shoulders, hips: None, normal, Overall Severity Severity of abnormal movements (highest score from questions above): None, normal Incapacitation due to abnormal movements: None, normal Patient's awareness of abnormal movements (rate only patient's report): No Awareness, Dental Status Current problems with teeth and/or dentures?: No Does patient usually wear dentures?: No  CIWA:    COWS:     Treatment Plan Summary: Daily contact with patient to assess and evaluate  symptoms and progress in treatment Medication management  Plan:  1. Admit for crisis management and stabilization.  2. Medication management to reduce current symptoms to base line and improve the patient's level of functioning: -Continue Latuda  60 mg daily for mood/delusions. - Continue Zyprexa zydis prn for agitations -Continue Vistaril 25 mg prn for anxiety and Trazodone 50 mg for sleep.  3. Develop treatment plan to decrease risk of relapse upon discharge of psychotic symptoms and the need for readmission.  5. Group therapy to facilitate development of healthy coping skills to use for psychosis.  6. Health care follow up as needed for medical problems. Apply Neosporin ointment twice daily to staples along right neck and to wrist.   7. Discharge plan to include therapy to help patient cope with stressors.   8. Clinical social worker will explore placement in Assisted living environment.  Medical Decision Making Problem Points:  Established problem, improving (1), Review of last therapy session (1) and Review of psycho-social stressors (1) Data Points:  Order Aims Assessment (2) Review or order clinical lab tests (1) Review of medication regiment &  side effects (2) Review of new medications or change in dosage (2)  I certify that inpatient services furnished can reasonably be expected to improve the patient's condition.   Thedore MinsMojeed Pradeep Beaubrun, MD  11/14/2013, 10:08 AM

## 2013-11-14 NOTE — Progress Notes (Signed)
The focus of this group is to help patients review their daily goal of treatment and discuss progress on daily workbooks. Pt attended the evening group session and responded to all discussion prompts from the Writer. Pt shared that today was a good day on the unit, the highlight of which was learning that he may be discharged Thursday. Pt also spoke of a positive visit from his Mother, whom he now considers supportive. Pt's only additional request from Nursing Staff this evening was to receive towels, which were given to him following group. Pt's affect was appropriate.

## 2013-11-14 NOTE — Progress Notes (Signed)
Patient ID: Peter Elliott, male   DOB: 1985-11-10, 28 y.o.   MRN: [id number] D. The patient started the evening out very irritable. He fell asleep in group snoring until he was awakened by a peer. When he awoke he had a bright mood and affect. His speech was pressured and rapid as he expressed tangential thoughts.  A. Attempted to meet with the patient to assess. Encouraged to attend evening group. R. The patient did not want to speak with staff stating," I'm tired of people asking me the same questions". He did attend evening group with minimal participation, as he slept through most of it.

## 2013-11-14 NOTE — Progress Notes (Addendum)
D:  Patient up and active in the milieu.  He has spent a great deal of time in the dayroom this evening.  Mother is visiting and there is a great deal of inappropriate closeness and hugging going on between them.  It was reported that mother has some FMLA paperwork that needs to be signed.  It was explained that the doctor and the social worker were both gone for the day and would be back in the morning.  Patient has taken all scheduled medications and is smiling and joking much of the evening.  It was also reported that he wanted to "knock Rod out" because he was not available to sign paperwork for his mother.   A:  Medications given as prescribed.  Offered support and encouragement.  Let patient know that I would pass a message to Rod regarding the paperwork. R:  Cooperative with staff.  Easily redirected.  Out in the milieu most of the shift.   Addendum:  Patient came to me and stated that he thought some of his staples had come out.  I examined the wounds and all staples are intact.  There is a small area of bleeding where his shirt collar has rubbed against the wound and a scab has rubbed off.  Bacitracin and a non adherent gauze were applied to the area at patients request to prevent further irritation of the wound.

## 2013-11-14 NOTE — Progress Notes (Signed)
D: Patient resting in bed with eyes closed.  Respirations even and unlabored.  Patient appears to be in no apparent distress. A: Staff to monitor Q 15 mins for safety.   R:Patient remains safe on the unit.  

## 2013-11-15 MED ORDER — TUBERCULIN PPD 5 UNIT/0.1ML ID SOLN
5.0000 [IU] | Freq: Once | INTRADERMAL | Status: AC
  Administered 2013-11-15: 5 [IU] via INTRADERMAL

## 2013-11-15 MED ORDER — NONFORMULARY OR COMPOUNDED ITEM
1.0000 [IU] | Freq: Once | Status: AC
Start: 2013-11-17 — End: 2013-11-17
  Administered 2013-11-17: 1 [IU] via TOPICAL

## 2013-11-15 NOTE — [facility] Group Notes (Signed)
[facility] Group Notes:  (Nursing/MHT/Case Management/Adjunct)  Date:  11/15/2013  Time:  10:06 AM  Type of Therapy:  Psychoeducational Skills  Participation Level:  Active  Participation Quality:  Appropriate and Attentive  Affect:  Appropriate  Cognitive:  Appropriate  Insight:  Appropriate  Engagement in Group:  Distracting  Modes of Intervention:  Clarification and Problem-solving  Summary of Progress/Problems:Morning wellness; good insight but in and out of group room  Baker Hughes IncorporatedJennifer Hundley Karma Hiney 11/15/2013, 10:06 AM

## 2013-11-15 NOTE — [facility] Group Notes (Signed)
[facility] LCSW Group Therapy  11/15/2013 , 12:19 PM   Type of Therapy:  Group Therapy  Participation Level:  Active  Participation Quality:  Attentive  Affect:  Appropriate  Cognitive:  Alert  Insight:  Improving  Engagement in Therapy:  Engaged  Modes of Intervention:  Discussion, Exploration and Socialization  Summary of Progress/Problems: Today's group focused on the term Diagnosis.  Participants were asked to define the term, and then pronounce whether it is a negative, positive or neutral term.  Weston Brassick, as usual, had much to say in group today.  Much of it in story form about someone he knows or his own experience related loosely to something another group member brought up.  For instance, "I have a friend who trusted in Allah and was healed of cancer."  But with much more detail.  He was able to rattle off about 6 diagnosis that he embraces, including Multiple Personality D/O and Asperger's Syndrome.  Daryel Geraldorth, Alivea Gladson B 11/15/2013 , 12:19 PM

## 2013-11-15 NOTE — Tx Team (Signed)
  Interdisciplinary Treatment Plan Update   Date Reviewed:  11/15/2013  Time Reviewed:  8:36 AM  Progress in Treatment:   Attending groups: Yes Participating in groups: Yes Taking medication as prescribed: Yes  Tolerating medication: Yes Family/Significant other contact made: Yes  Patient understands diagnosis: Yes  Discussing patient identified problems/goals with staff: Yes Medical problems stabilized or resolved: Yes Denies suicidal/homicidal ideation: Yes Patient has not harmed self or others: Yes  For review of initial/current patient goals, please see plan of care.  Estimated Length of Stay:  2-4 days  Reason for Continuation of Hospitalization: Hallucinations Medication stabilization Other; describe mood lability  New Problems/Goals identified:  N/A  Discharge Plan or Barriers:   return home while waiting for placement  Additional Comments:   Patient appears to have minimal insight into his mental illness, his behavior has been unpredictable. He remains easily agitated, labile, argumentative and often defiant and oppositional. He continues to endorse suicidal thoughts but able to contract for safety. Patient does not want to return home to his father upon discharge, he is requesting to be discharged to an assisted living environment where he believes he can have more freedom.   Attendees:  Signature: Thedore MinsMojeed Akintayo, MD 11/15/2013 8:36 AM   Signature: Richelle Itood Staysha Truby, LCSW 11/15/2013 8:36 AM  Signature: Fransisca KaufmannLaura Davis, NP 11/15/2013 8:36 AM  Signature: Joslyn Devonaroline Beaudry, RN 11/15/2013 8:36 AM  Signature: Liborio NixonPatrice White, RN 11/15/2013 8:36 AM  Signature:  11/15/2013 8:36 AM  Signature:   11/15/2013 8:36 AM  Signature:    Signature:    Signature:    Signature:    Signature:    Signature:      Scribe for Treatment Team:   Richelle Itood Jaymond Waage, LCSW  11/15/2013 8:36 AM
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
## 2013-11-15 NOTE — Progress Notes (Signed)
[facility] Group Notes:  (Nursing/MHT/Case Management/Adjunct)  Date:  11/15/2013  Time:  10:14 PM  Type of Therapy:  Psychoeducational Skills  Participation Level:  Active  Participation Quality:  Monopolizing  Affect:  Anxious  Cognitive:  Disorganized  Insight:  Lacking  Engagement in Group:  Off Topic  Modes of Intervention:  Support  Summary of Progress/Problems: Patient stated he was happy until his parents came because it brought out a lot of negative emotions. Patient then started to go off on tangents in which he mentioned that [place] and wants to move out the [place] to get away.  Peter LavJohn W Keller Elliott 11/15/2013, 10:14 PM
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
## 2013-11-15 NOTE — Progress Notes (Signed)
Patient ID: Peter Elliott, male   DOB: 06/11/1986, 28 y.o.   MRN: [id number]  D: Patient smiling and joking on approach. Reports that he feels more sad than depressed. Gives depression "4" and feelings of hopelessness "0". Currently denies any SI. Has some religious preoccupation this am when speaking to him this am. Conversation tangential this am. A: Staff will monitor on q 15 minute checks, follow treatment plan, and give meds as ordered. R: Cooperative on unit

## 2013-11-16 MED ORDER — METOPROLOL TARTRATE 25 MG PO TABS
25.0000 mg | ORAL_TABLET | Freq: Two times a day (BID) | ORAL | Status: DC
  Administered 2013-11-16 – 2013-11-24 (×17): 25 mg via ORAL
  Filled 2013-11-16 (×22): qty 1

## 2013-11-16 MED ORDER — LURASIDONE HCL 80 MG PO TABS
80.0000 mg | ORAL_TABLET | Freq: Every day | ORAL | Status: DC
  Administered 2013-11-16 – 2013-11-20 (×5): 80 mg via ORAL
  Filled 2013-11-16 (×6): qty 1

## 2013-11-16 NOTE — Progress Notes (Addendum)
Patient ID: Peter Elliott, male   DOB: 1985/09/30, 28 y.o.   MRN: [id number]  D: Pt. Denies SI/HI and A/V Hallucinations. Patient was experiencing a headache 3/10 and received PRN Tylenol and upon reassessment reported that it was effective. Patient continues to report that he can predict the future and has proved it over and over. Patient admits that he is obsessed with religion. Patient was upset with MD about his diagnosis but after speaking with MD the patient verbalized understanding. Patient is grandiose, religiously preoccupied, and labile in mood.  A: Support and encouragement provided to the patient. Scheduled medications administered to patient per physician's orders.  R: Patient is receptive and cooperative but silly at times. Q15 minute checks are maintained for safety.
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
## 2013-11-16 NOTE — [facility] Group Notes (Signed)
[facility]   11/16/2013 11:02 AM  Participation Quality:  Did not attend    Ida Rogueodney B Tandy Grawe

## 2013-11-16 NOTE — [facility] Group Notes (Signed)
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  Peter Elliott came to group late.   He listened quietly while the speaker told his story.  He stated that [facility].  He smiled when the speaker spoke about his passion for music and mental health.  He applauded after every song and told the speaker that he played beautifully.    Peter Elliott   11/16/2013  1:07 PM

## 2013-11-16 NOTE — Progress Notes (Signed)
Adult Psychoeducational Group Note  Date:  11/16/2013 Time:  10:58 AM  Group Topic/Focus:  Crisis Planning:   The purpose of this group is to help patients create a crisis plan for use upon discharge or in the future, as needed.  Participation Level:  Active  Participation Quality:  Appropriate  Affect:  Appropriate  Cognitive:  Appropriate  Insight: Appropriate  Engagement in Group:  Engaged  Modes of Intervention:  Discussion  Additional Comments:  Pt attended group this morning. Pt was appropriate and participate in group.   Donte Kary A  Jon 11/16/2013, 10:58 AM

## 2013-11-17 NOTE — [facility] Suicide Risk Assessment (Signed)
[facility] INPATIENT:  Family/Significant Other Suicide Prevention Education  Suicide Prevention Education:  Education Completed; Peter Elliott, father, [phone] has been identified by the patient as the family member/significant other with whom the patient will be residing, and identified as the person(s) who will aid the patient in the event of a mental health crisis (suicidal ideations/suicide attempt).  With written consent from the patient, the family member/significant other has been provided the following suicide prevention education, prior to the and/or following the discharge of the patient.  The suicide prevention education provided includes the following:  Suicide risk factors  Suicide prevention and interventions  National Suicide Hotline telephone number  [facility] assessment telephone number  [facility] 911  [facility] and/or [facility] telephone number  Request made of family/significant other to:  Remove weapons (e.g., guns, rifles, knives), all items previously/currently identified as safety concern.    Remove drugs/medications (over-the-counter, prescriptions, illicit drugs), all items previously/currently identified as a safety concern.  The family member/significant other verbalizes understanding of the suicide prevention education information provided.  The family member/significant other agrees to remove the items of safety concern listed above.  Peter Elliott 11/17/2013, 4:19 PM

## 2013-11-17 NOTE — Progress Notes (Signed)
Patient ID: Peter Elliott, male   DOB: 03-27-1986, 28 y.o.   MRN: [id number] [facility]  11/17/2013 2:34 PM Peter Elliott  MRN:  [id number]  Subjective: Patient states "I am feeling more stable. I talked to my Dad. I apologized for some horrible things I said. I have been praying to Allah for peace. I realize now I could have died when I cut my neck. They told me I almost died several times in surgery. I was called the miracle man. But I know I can't take any more chances with my life like that. I really want to get my life together. I may have to live with my father for a while until I can get my own place. I am very excited about that."   Objective: Patient is seen and chart is reviewed. Patient remains very grandiose and delusional. Peter Elliott talks today about how he almost died in surgery and how staff reported he was a "miracle." However, the patient is now admitting that he could have died. He has maintained on past assessments that he knew the God Allah would protect him from death. Nursing staff report that patient still talks about being able to predict future major events. The patient has been making gradual improvements since his dose of Latuda has been increased. Patient remains easily agitated, sarcastic, argumentative, defiant and oppositional at times. He is  compliant with his medications and has not verbalized any adverse reactions. Patient is now open to going back to live with father reporting that the family has talk openly about previous concerns. He is now denying any thoughts of self harm.   Diagnosis:   DSM5: Schizophrenia Disorders:  Delusional Disorder (297.1) Obsessive-Compulsive Disorders:   Trauma-Stressor Disorders:  Posttraumatic Stress Disorder (309.81) Substance/Addictive Disorders:   Depressive Disorders:   Total Time spent with patient: 20 minutes  Axis I: Schizoaffective disorder bipolar type           Post traumatic stress disorder  Axis II: Cluster  B Traits Axis III:  Past Medical History  Diagnosis Date  . Hypertension    Axis IV: other psychosocial or environmental problems, problems related to social environment and problems with primary support group  ADL's:  Intact  Sleep: Good  Appetite:  Fair  Suicidal Ideation: Denies Plan:  denies Intent:  denies Means:  denies Homicidal Ideation:  denies AEB (as evidenced by):  Psychiatric Specialty Exam: Physical Exam  Psychiatric: His affect is angry and labile. His speech is rapid and/or pressured and tangential. He is agitated and aggressive. Thought content is paranoid and delusional. Cognition and memory are normal. He expresses impulsivity. He exhibits a depressed mood. He expresses suicidal ideation.    Review of Systems  Constitutional: Negative.   HENT: Negative.   Eyes: Negative.   Respiratory: Negative.   Cardiovascular: Negative.   Gastrointestinal: Negative.   Genitourinary: Negative.   Musculoskeletal: Negative.   Skin: Negative.   Neurological: Negative.   Endo/Heme/Allergies: Negative.   Psychiatric/Behavioral: Positive for depression. Negative for suicidal ideas. The patient is nervous/anxious and has insomnia.     Blood pressure 127/79, pulse 92, temperature 97.8 F (36.6 C), temperature source Oral, resp. rate 20, height 5' 4.5" (1.638 m), weight 103.42 kg (228 lb).Body mass index is 38.55 kg/(m^2).  General Appearance: fairly groomed  Patent attorneyye Contact::  Good  Speech:  Pressured  Volume:  Normal  Mood:  Anxious  Affect:  Labile and Full Range  Thought Process:  Circumstantial and Disorganized  Orientation:  Full (Time, Place, and Person)  Thought Content:  Delusions and grandiose  Suicidal Thoughts:  No  Homicidal Thoughts:  No  Memory:  Immediate;   Fair Recent;   Fair Remote;   Fair  Judgement:  Poor  Insight:  Lacking  Psychomotor Activity:  Restlessness  Concentration:  Fair  Recall:  FiservFair  Fund of Knowledge:Fair  Language: Good   Akathisia:  No  Handed:  Right  AIMS (if indicated):     Assets:  Communication Skills Physical Health Social Support  Sleep:  Number of Hours: 6.75   Musculoskeletal: Strength & Muscle Tone: within normal limits Gait & Station: normal Patient leans: N/A  Current Medications: Current Facility-Administered Medications  Medication Dose Route Frequency Provider Last Rate Last Dose  . acetaminophen (TYLENOL) tablet 650 mg  650 mg Oral Q6H PRN Kerry HoughSpencer E Simon, PA-C   650 mg at 11/16/13 [id number]  . alum & mag hydroxide-simeth (MAALOX/MYLANTA) 200-200-20 MG/5ML suspension 30 mL  30 mL Oral Q4H PRN Kerry HoughSpencer E Simon, PA-C      . hydrOXYzine (ATARAX/VISTARIL) tablet 25 mg  25 mg Oral Q6H PRN Kerry HoughSpencer E Simon, PA-C      . loratadine (CLARITIN) tablet 10 mg  10 mg Oral Daily Truly Stankiewicz   10 mg at 11/17/13 [id number]  . lurasidone (LATUDA) tablet 80 mg  80 mg Oral q1800 Akaya Proffit   80 mg at 11/16/13 1715  . magnesium hydroxide (MILK OF MAGNESIA) suspension 30 mL  30 mL Oral Daily PRN Kerry HoughSpencer E Simon, PA-C   30 mL at 11/17/13 0841  . metoprolol tartrate (LOPRESSOR) tablet 25 mg  25 mg Oral BID Fransisca KaufmannLaura Davis, NP   25 mg at 11/17/13 [id number]  . neomycin-bacitracin-polymyxin (NEOSPORIN) ointment   Topical BID Fransisca KaufmannLaura Davis, NP   15 application at 11/17/13 [phone]  . NONFORMULARY OR COMPOUNDED ITEM 1 Units  1 Units Topical Once Laiyah Exline      . OLANZapine zydis (ZYPREXA) disintegrating tablet 5 mg  5 mg Oral Q8H PRN Laquanta Hummel      . pantoprazole (PROTONIX) EC tablet 40 mg  40 mg Oral Daily Dejana Pugsley   40 mg at 11/17/13 0838  . polyvinyl alcohol (LIQUIFILM TEARS) 1.4 % ophthalmic solution 1 drop  1 drop Both Eyes PRN Caprice KluverVinay P Saranga, MD   1 drop at 11/15/13 1323  . traZODone (DESYREL) tablet 50 mg  50 mg Oral QHS PRN Jersey Espinoza   50 mg at 11/16/13 2225    Lab Results: No results found for this or any previous visit (from the past 48 hour(s)).  Physical Findings: AIMS: Facial and Oral  Movements Muscles of Facial Expression: None, normal Lips and Perioral Area: None, normal Jaw: None, normal Tongue: None, normal,Extremity Movements Upper (arms, wrists, hands, fingers): None, normal Lower (legs, knees, ankles, toes): None, normal, Trunk Movements Neck, shoulders, hips: None, normal, Overall Severity Severity of abnormal movements (highest score from questions above): None, normal Incapacitation due to abnormal movements: None, normal Patient's awareness of abnormal movements (rate only patient's report): No Awareness, Dental Status Current problems with teeth and/or dentures?: No Does patient usually wear dentures?: No  CIWA:    COWS:     Treatment Plan Summary: Daily contact with patient to assess and evaluate symptoms and progress in treatment Medication management  Plan:  1. Continue crisis management and stabilization.  2. Medication management to reduce current symptoms to base line and improve the patient's level of functioning: -Continue Latuda 80 mg daily for  mood/delusions. - Continue Zyprexa zydis prn for agitations -Continue Vistaril 25 mg prn for anxiety and Trazodone 50 mg for sleep.  3. Develop treatment plan to decrease risk of relapse upon discharge of psychotic symptoms and the need for readmission.  5. Group therapy to facilitate development of healthy coping skills to use for psychosis.  6. Health care follow up as needed for medical problems. Patient has follow up appointment on 11/24/13 with vascular surgeon for removal of staples.  7. Discharge plan to include therapy to help patient cope with stressors.  Possible discharge on 11/21/13.   Medical Decision Making Problem Points:  Established problem, improving (1), Review of last therapy session (1) and Review of psycho-social stressors (1) Data Points:  Order Aims Assessment (2) Review of medication regiment & side effects (2) Review of new medications or change in dosage (2)  I certify that  inpatient services furnished can reasonably be expected to improve the patient's condition.   Fransisca Kaufmann, NP-C 11/17/2013, 2:34 PM  Patient seen, evaluated and I agree with notes by Nurse Practitioner. Thedore Mins, MD

## 2013-11-17 NOTE — [facility] Group Notes (Signed)
[facility] Group Notes:  (Nursing/MHT/Case Management/Adjunct)  Date:  11/17/2013  Time:  10:56 AM  Type of Therapy:  Nurse Education  Participation Level:  Active  Participation Quality:  Intrusive, Monopolizing and Redirectable  Affect:  Excited  Cognitive:  Alert  Insight:  Improving  Engagement in Group:  Distracting and Engaged  Modes of Intervention:  Discussion  Summary of Progress/Problems:  Peter Elliott E [name] 11/17/2013, 10:56 AM

## 2013-11-17 NOTE — Progress Notes (Signed)
Patient ID: Peter Elliott, male   DOB: Mar 12, 1986, 28 y.o.   MRN: [id number]  D: Pt. Denies SI/HI and A/V Hallucinations. Patient does not report any pain or discomfort at this time. Patient is seen in the milieu. Patient continues to be grandiose and reports that he can predict events. Patient was in group and was speaking in a [place] accent to the other RN on the hall while answering questions. Patient also was speaking to another patient it what seemed to be another language but it was mostly jibberish.  A: Support and encouragement provided to the patient. Scheduled medications given to patient per physician's orders.  R: Patient is receptive and cooperative but tangential in conversation. Patient is also intrusive at times but easily redirectable.  Q15 minute checks are maintained for safety.

## 2013-11-17 NOTE — Progress Notes (Signed)
Patient ID: Peter Elliott, male   DOB: 18-Dec-1985, 28 y.o.   MRN: [id number]  Patient's PPD was read and no induration was noted. No swelling, redness, or warmth was seen.

## 2013-11-17 NOTE — [facility] Group Notes (Signed)
[facility] Group Notes:  (Counselor/Nursing/MHT/Case Management/Adjunct)  11/17/2013 1:15PM  Type of Therapy:  Group Therapy  Participation Level:  Active  Participation Quality:  Appropriate  Affect:  Flat  Cognitive:  Oriented  Insight:  Improving  Engagement in Group:  Limited  Engagement in Therapy:  Limited  Modes of Intervention:  Discussion, Exploration and Socialization  Summary of Progress/Problems: The topic for group was balance in life.  Pt participated in the discussion about when their life was in balance and out of balance and how this feels.  Pt discussed ways to get back in balance and short term goals they can work on to get where they want to be.  [name] was engaged initially, but was then called out of group.  He returned right before we ended.  He shared that feels balanced today, and talked about his optimism in terms of going forward from this point.   Ida Rogueorth, Sindy Mccune B 11/17/2013 2:37 PM
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
## 2013-11-18 NOTE — Progress Notes (Signed)
D: Patient denies SI/HI and A/V hallucinations; patient reports sleep is poor; reports appetite is good; reports energy level is normal ; reports ability to pay attention is good; rates depression as 0/10; rates hopelessness 0/10; patient reporting that his premonition came true and that the physician agreed with him that all his supernatural predictions came true  A: Monitored q 15 minutes; patient encouraged to attend groups; patient educated about medications; patient given medications per physician orders; patient encouraged to express feelings and/or concerns  R: Patient can still tangential and hyper verbal but can be redirected; patient is animated; patient is cooperative and pleasant;   patient's interaction with staff and peers is appropriate; patient was able to set goal to talk with staff 1:1 when having feelings of SI; patient is taking medications as prescribed and tolerating medications; patient is attending all groups

## 2013-11-18 NOTE — Tx Team (Signed)
  Interdisciplinary Treatment Plan Update   Date Reviewed:  11/18/2013  Time Reviewed:  3:41 PM  Progress in Treatment:   Attending groups: Yes Participating in groups: Yes Taking medication as prescribed: Yes  Tolerating medication: Yes Family/Significant other contact made: Yes  Patient understands diagnosis: Yes  Discussing patient identified problems/goals with staff: Yes Medical problems stabilized or resolved: Yes Denies suicidal/homicidal ideation: Yes Patient has not harmed self or others: Yes  For review of initial/current patient goals, please see plan of care.  Estimated Length of Stay:  2-3 days  Reason for Continuation of Hospitalization: Depression Medication stabilization  New Problems/Goals identified:  N/A  Discharge Plan or Barriers:   return home, follow up outpt  Additional Comments:  Patient states "I am feeling less agitated or irritable and guess what I have been sleeping better. Also, I have decided to go home with my father when I am discharged rather than go to a shelter.''  . Patient is endorsing decreased mood lability, racing thoughts and sleeping better. However, he remains paranoid and grandiose. He is religiously preoccupied, believes he has a super natural power and gifted with ability to predict the future.    Attendees:  Signature: Thedore MinsMojeed Akintayo, MD 11/18/2013 3:41 PM   Signature: Richelle Itood Ashawn Rinehart, LCSW 11/18/2013 3:41 PM  Signature: Fransisca KaufmannLaura Davis, NP 11/18/2013 3:41 PM  Signature: Joslyn Devonaroline Beaudry, RN 11/18/2013 3:41 PM  Signature: Liborio NixonPatrice White, RN 11/18/2013 3:41 PM  Signature:  11/18/2013 3:41 PM  Signature:   11/18/2013 3:41 PM  Signature:    Signature:    Signature:    Signature:    Signature:    Signature:      Scribe for Treatment Team:   Richelle Itood Jessyka Austria, LCSW  11/18/2013 3:41 PM

## 2013-11-18 NOTE — Progress Notes (Signed)
D   Pt is pleasant on approach and has been cooperative   He complained of his neck where the staples are bothering him   He said it itches    There is a spot that looks like it is starting to fester   Its crusty and a little swollen but he refused the antibiotic ointment because it messes up his pillow   His mood is less labile but he continues to make delusional and grandiose statements A   Verbal support given   Medications administered and effectiveness monitored   Q 15 min checks    Administered vistaril for itching R   Pt safe at present

## 2013-11-18 NOTE — Progress Notes (Signed)
Patient ID: Peter Elliott, male   DOB: November 18, 1985, 28 y.o.   MRN: [id number] [facility]  11/18/2013 10:44 AM Peter Elliott  MRN:  [id number]  Subjective: Patient states "I am feeling less agitated or irritable and guess what I have been sleeping better. Also, I have decided to go home with my father when I am discharged rather than go to a shelter.''  Objective: Patient is seen and chart is reviewed. Patient is endorsing decreased mood lability, racing thoughts and sleeping better. However, he remains paranoid and grandiose. He is religiously preoccupied, believes he has a super natural power and gifted with ability to predict the future.  He is  compliant with his medications and has not verbalized any adverse reactions to her medications. He is still verbalizing passive suicidal thoughts. Diagnosis:   DSM5: Schizophrenia Disorders:  Delusional Disorder (297.1) Obsessive-Compulsive Disorders:   Trauma-Stressor Disorders:  Posttraumatic Stress Disorder (309.81) Substance/Addictive Disorders:   Depressive Disorders:   Total Time spent with patient: 20 minutes  Axis I: Schizoaffective disorder bipolar type           Post traumatic stress disorder  Axis II: Cluster B Traits Axis III:  Past Medical History  Diagnosis Date  . Hypertension    Axis IV: other psychosocial or environmental problems, problems related to social environment and problems with primary support group  ADL's:  Intact  Sleep: Fair  Appetite:  Fair  Suicidal Ideation: yes Plan:  denies Intent:  denies Means:  denies Homicidal Ideation:  denies AEB (as evidenced by):  Psychiatric Specialty Exam: Physical Exam  Psychiatric: His mood appears anxious. His speech is rapid and/or pressured. He is aggressive. Thought content is paranoid and delusional. Cognition and memory are normal. He expresses impulsivity. He expresses suicidal ideation.    Review of Systems  Constitutional: Negative.   HENT:  Negative.   Eyes: Negative.   Respiratory: Negative.   Cardiovascular: Negative.   Gastrointestinal: Negative.   Genitourinary: Negative.   Musculoskeletal: Negative.   Skin: Negative.   Neurological: Negative.   Endo/Heme/Allergies: Negative.   Psychiatric/Behavioral: Positive for depression and suicidal ideas. The patient is nervous/anxious.     Blood pressure 114/72, pulse 83, temperature 98 F (36.7 C), temperature source Oral, resp. rate 18, height 5' 4.5" (1.638 m), weight 103.42 kg (228 lb).Body mass index is 38.55 kg/(m^2).  General Appearance: fairly groomed  Patent attorneyye Contact::  Good  Speech:  Pressured  Volume:  Increased  Mood:  Angry and Irritable  Affect:  Labile and Full Range  Thought Process:  Circumstantial and Disorganized  Orientation:  Full (Time, Place, and Person)  Thought Content:  Delusions and grandiose  Suicidal Thoughts:  yes  Homicidal Thoughts:  No  Memory:  Immediate;   Fair Recent;   Fair Remote;   Fair  Judgement:  Poor  Insight:  Lacking  Psychomotor Activity:  Increased  Concentration:  Fair  Recall:  FiservFair  Fund of Knowledge:Fair  Language: Good  Akathisia:  No  Handed:  Right  AIMS (if indicated):     Assets:  Communication Skills Physical Health Social Support  Sleep:  Number of Hours: 6   Musculoskeletal: Strength & Muscle Tone: within normal limits Gait & Station: normal Patient leans: N/A  Current Medications: Current Facility-Administered Medications  Medication Dose Route Frequency Provider Last Rate Last Dose  . acetaminophen (TYLENOL) tablet 650 mg  650 mg Oral Q6H PRN Kerry HoughSpencer E Simon, PA-C   650 mg at 11/16/13 [id number]  . alum &  mag hydroxide-simeth (MAALOX/MYLANTA) 200-200-20 MG/5ML suspension 30 mL  30 mL Oral Q4H PRN Kerry Hough, PA-C      . hydrOXYzine (ATARAX/VISTARIL) tablet 25 mg  25 mg Oral Q6H PRN Kerry Hough, PA-C   25 mg at 11/17/13 2158  . loratadine (CLARITIN) tablet 10 mg  10 mg Oral Daily Tianni Escamilla    10 mg at 11/18/13 0813  . lurasidone (LATUDA) tablet 80 mg  80 mg Oral q1800 Yocheved Depner   80 mg at 11/17/13 1708  . magnesium hydroxide (MILK OF MAGNESIA) suspension 30 mL  30 mL Oral Daily PRN Kerry Hough, PA-C   30 mL at 11/17/13 0841  . metoprolol tartrate (LOPRESSOR) tablet 25 mg  25 mg Oral BID Fransisca Kaufmann, NP   25 mg at 11/18/13 0813  . neomycin-bacitracin-polymyxin (NEOSPORIN) ointment   Topical BID Fransisca Kaufmann, NP   15 application at 11/18/13 [id number]  . OLANZapine zydis (ZYPREXA) disintegrating tablet 5 mg  5 mg Oral Q8H PRN Jontavious Commons      . pantoprazole (PROTONIX) EC tablet 40 mg  40 mg Oral Daily Emilian Stawicki   40 mg at 11/18/13 0813  . polyvinyl alcohol (LIQUIFILM TEARS) 1.4 % ophthalmic solution 1 drop  1 drop Both Eyes PRN Caprice Kluver, MD   1 drop at 11/15/13 1323  . traZODone (DESYREL) tablet 50 mg  50 mg Oral QHS PRN Ryver Poblete   50 mg at 11/17/13 2158    Lab Results: No results found for this or any previous visit (from the past 48 hour(s)).  Physical Findings: AIMS: Facial and Oral Movements Muscles of Facial Expression: None, normal Lips and Perioral Area: None, normal Jaw: None, normal Tongue: None, normal,Extremity Movements Upper (arms, wrists, hands, fingers): None, normal Lower (legs, knees, ankles, toes): None, normal, Trunk Movements Neck, shoulders, hips: None, normal, Overall Severity Severity of abnormal movements (highest score from questions above): None, normal Incapacitation due to abnormal movements: None, normal Patient's awareness of abnormal movements (rate only patient's report): No Awareness, Dental Status Current problems with teeth and/or dentures?: No Does patient usually wear dentures?: No  CIWA:    COWS:     Treatment Plan Summary: Daily contact with patient to assess and evaluate symptoms and progress in treatment Medication management  Plan:  1. Continue crisis management and stabilization.  2. Medication  management to reduce current symptoms to base line and improve the patient's level of functioning: -Continue Latuda  80 mg daily for mood/delusions. - Continue Zyprexa zydis prn for agitations -Continue Vistaril 25 mg prn for anxiety and Trazodone 50 mg for sleep.  3. Develop treatment plan to decrease risk of relapse upon discharge of psychotic symptoms and the need for readmission.  5. Group therapy to facilitate development of healthy coping skills to use for psychosis.  6. Health care follow up as needed for medical problems. Apply Neosporin ointment twice daily to staples along right neck and to wrist.   7. Discharge plan to include therapy to help patient cope with stressors.   8. Clinical social worker will explore placement in Assisted living environment. PPD to be placed today.   Medical Decision Making Problem Points:  Established problem, improving (1), Review of last therapy session (1) and Review of psycho-social stressors (1) Data Points:  Order Aims Assessment (2) Review of medication regiment & side effects (2)  I certify that inpatient services furnished can reasonably be expected to improve the patient's condition.   Thedore Mins, MD  11/18/2013, 10:44 AM

## 2013-11-18 NOTE — Progress Notes (Signed)
Adult Psychoeducational Group Note  Date:  11/18/2013 Time:  6:41 PM  Group Topic/Focus:  Early Warning Signs:   The focus of this group is to help patients identify signs or symptoms they exhibit before slipping into an unhealthy state or crisis.  Participation Level:  Active  Participation Quality:  Appropriate, Attentive, Sharing and Supportive  Affect:  Appropriate  Cognitive:  Appropriate  Insight: Appropriate  Engagement in Group:  Engaged  Modes of Intervention:  Discussion  Additional Comments:    Kentrail Shew C Tymon Nemetz 11/18/2013, 6:41 PM 
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
## 2013-11-18 NOTE — Progress Notes (Signed)
Patient ID: Peter Elliott, Peter Elliott   DOB: 03/22/1986, 28 y.o.   MRN: [id number] Pt reporting this evening that he has been walking around smiling trying to make everyone feel that he is ok but he is not.  Pt stated "I don't want to be here".  When asked what he meant my that statement, pt reported he does not want to be in this world.  Pt stated he is saying whatever he feels he need to say to get another opportunity to try to hurt himself.  Pt stated [facility] that he should have done it in the woods so he would not have been found.  Pt not contracting for safety at this time. Close observation has been initiated for patient safety.  Close observation status was explained to patient.

## 2013-11-18 NOTE — [facility] Group Notes (Signed)
[facility]   11/18/2013 9:58 AM  Participation Quality:  Engaged  Mood/Affect:  Excited  Depression Rating:  denies  Anxiety Rating:  denies  Thoughts of Suicide:  No Will you contract for safety?   NA  Current AVH:  No  Plan for Discharge/Comments:  Peter Elliott is upbeat and laughing.  He feels good about his treatment, and the plan he is now outlining is to return to his father's home and [facility].  [employer] Means: father  Supports: family  [name]

## 2013-11-19 MED ORDER — OLANZAPINE 10 MG PO TBDP
10.0000 mg | ORAL_TABLET | Freq: Two times a day (BID) | ORAL | Status: DC | PRN
  Administered 2013-11-20: 10 mg via ORAL
  Filled 2013-11-19 (×2): qty 1

## 2013-11-19 NOTE — Progress Notes (Signed)
Patient ID: Peter Elliott, male   DOB: [birth year], 28 y.o.   MRN: [id number] 1:1 Nursing Note: The patient has been resting in bed with his eyes closed all evening. He did not attend evening group. No distress noted. 1:1 maintained for safety. Will continue to monitor.

## 2013-11-19 NOTE — Progress Notes (Signed)
Patient ID: Peter Elliott, male   DOB: [birth year], 28 y.o.   MR[facility]: [id number] [facility]  11/19/2013 11:27 AM Peter Elliott  MRN:  [id number]  Subjective: Patient states not to feel agitated and was lying in bed but cooperative.  Objective: Patient is seen and chart is reviewed. Patient is endorsing decreased mood lability, racing thoughts and sleeping better. However, he remains paranoid and grandiose and releigiously pre occupied. He is  compliant with his medications and has not verbalized any adverse reactions to her medications. He is still verbalizing passive suicidal thoughts. Says i would hurt someone if they mess with me.  Diagnosis:   DSM5: Schizophrenia Disorders:  Delusional Disorder (297.1) Obsessive-Compulsive Disorders:   Trauma-Stressor Disorders:  Posttraumatic Stress Disorder (309.81) Substance/Addictive Disorders:   Depressive Disorders:   Total Time spent with patient: 20 minutes  Axis I: Schizoaffective disorder bipolar type           Post traumatic stress disorder  Axis II: Cluster B Traits Axis III:  Past Medical History  Diagnosis Date  . Hypertension    Axis IV: other psychosocial or environmental problems, problems related to social environment and problems with primary support group  ADL's:  Intact  Sleep: Fair  Appetite:  Fair  Suicidal Ideation: yes Plan:  denies Intent:  denies Means:  denies Homicidal Ideation:  denies AEB (as evidenced by):  Psychiatric Specialty Exam: Physical Exam  Psychiatric: His mood appears anxious. His speech is rapid and/or pressured. He is aggressive. Thought content is paranoid and delusional. Cognition and memory are normal. He expresses impulsivity. He expresses suicidal ideation.    Review of Systems  Constitutional: Negative.   HENT: Negative.   Eyes: Negative.   Respiratory: Negative.   Cardiovascular: Negative.   Gastrointestinal: Negative.   Genitourinary: Negative.   Musculoskeletal:  Negative.   Skin: Negative.   Neurological: Negative.   Endo/Heme/Allergies: Negative.   Psychiatric/Behavioral: Positive for depression and suicidal ideas. The patient is nervous/anxious.     Blood pressure 116/81, pulse 85, temperature 98 F (36.7 C), temperature source Oral, resp. rate 17, height 5' 4.5" (1.638 m), weight 103.42 kg (228 lb).Body mass index is 38.55 kg/(m^2).  General Appearance: fairly groomed  Patent attorneyye Contact::  Good  Speech:  Pressured  Volume:  Increased  Mood:  Angry and Irritable  Affect:  Labile and Full Range  Thought Process:  Circumstantial and Disorganized  Orientation:  Full (Time, Place, and Person)  Thought Content:  Delusions and grandiose  Suicidal Thoughts:  yes  Homicidal Thoughts:  No  Memory:  Immediate;   Fair Recent;   Fair Remote;   Fair  Judgement:  Poor  Insight:  Lacking  Psychomotor Activity:  Increased  Concentration:  Fair  Recall:  [employer]Fair  Fund of Knowledge:Fair  Language: Good  Akathisia:  No  Handed:  Right  AIMS (if indicated):     Assets:  Communication Skills Physical Health Social Support  Sleep:  Number of Hours: 6   Musculoskeletal: Strength & Muscle Tone: within normal limits Gait & Station: normal Patient leans: N/A  Current Medications: Current Facility-Administered Medications  Medication Dose Route Frequency Provider Last Rate Last Dose  . acetaminophen (TYLENOL) tablet 650 mg  650 mg Oral Q6H PRN Kerry HoughSpencer E Simon, PA-C   650 mg at 11/16/13 [id number]  . alum & mag hydroxide-simeth (MAALOX/MYLANTA) 200-200-20 MG/5ML suspension 30 mL  30 mL Oral Q4H PRN Kerry HoughSpencer E Simon, PA-C      . hydrOXYzine (ATARAX/VISTARIL) tablet 25  mg  25 mg Oral Q6H PRN Kerry HoughSpencer E Simon, PA-C   25 mg at 11/17/13 2158  . loratadine (CLARITIN) tablet 10 mg  10 mg Oral Daily Mojeed Akintayo   10 mg at 11/19/13 0800  . lurasidone (LATUDA) tablet 80 mg  80 mg Oral q1800 Mojeed Akintayo   80 mg at 11/18/13 1823  . magnesium hydroxide (MILK OF MAGNESIA)  suspension 30 mL  30 mL Oral Daily PRN Kerry HoughSpencer E Simon, PA-C   30 mL at 11/17/13 0841  . metoprolol tartrate (LOPRESSOR) tablet 25 mg  25 mg Oral BID Fransisca KaufmannLaura Davis, NP   25 mg at 11/19/13 0800  . neomycin-bacitracin-polymyxin (NEOSPORIN) ointment   Topical BID Fransisca KaufmannLaura Davis, NP   15 application at 11/19/13 0800  . OLANZapine zydis (ZYPREXA) disintegrating tablet 5 mg  5 mg Oral Q8H PRN Mojeed Akintayo   5 mg at 11/19/13 0800  . pantoprazole (PROTONIX) EC tablet 40 mg  40 mg Oral Daily Mojeed Akintayo   40 mg at 11/19/13 0800  . polyvinyl alcohol (LIQUIFILM TEARS) 1.4 % ophthalmic solution 1 drop  1 drop Both Eyes PRN Caprice KluverVinay P Saranga, MD   1 drop at 11/15/13 1323  . traZODone (DESYREL) tablet 50 mg  50 mg Oral QHS PRN Mojeed Akintayo   50 mg at 11/17/13 2158    Lab Results: No results found for this or any previous visit (from the past 48 hour(s)).  Physical Findings: AIMS CIWA:    COWS:     Treatment Plan Summary: Daily contact with patient to assess and evaluate symptoms and progress in treatment Medication management  Plan:  1. Continue crisis management and stabilization.  2. Medication management to reduce current symptoms to base line and improve the patient's level of functioning: -Continue Latuda  80 mg daily for mood/delusions. - Continue Zyprexa zydis prn for agitations -Continue Vistaril 25 mg prn for anxiety and Trazodone 50 mg for sleep.  3. Develop treatment plan to decrease risk of relapse upon discharge of psychotic symptoms and the need for readmission.  5. Group therapy to facilitate development of healthy coping skills to use for psychosis.  6. Health care follow up as needed for medical problems. Apply Neosporin ointment twice daily to staples along right neck and to wrist.   7. Discharge plan to include therapy to help patient cope with stressors.   8. Clinical social worker will explore placement in Assisted living environment. PPD to be placed today.   Medical  Decision Making Problem Points:  Established problem, improving (1), Review of last therapy session (1) and Review of psycho-social stressors (1) Data Points:  Medication review and side effects.   I certify that inpatient services furnished can reasonably be expected to improve the patient's condition.   Thresa RossNadeem Azrael Maddix, MD 11/19/2013, 11:27 AM

## 2013-11-19 NOTE — [facility] Group Notes (Signed)
[facility] LCSW Group Therapy Note  11/19/2013 / 11:15 AM  Type of Therapy and Topic:  Group Therapy: Avoiding Self-Sabotaging and Enabling Behaviors  Participation Level:  Did Not Attend    Madylyn Insco C Athleen Feltner, LCSW    

## 2013-11-19 NOTE — Progress Notes (Signed)
Did not attended group 

## 2013-11-19 NOTE — Progress Notes (Signed)
Patient ID: Peter Elliott, male   DOB: 01-18-1986, 28 y.o.   MRN: [id number] 1:1 Nursing Note: The patient is resting in bed with eyes closed. No distress noted. 1:1 maintained for safety. Will continue to monitor.

## 2013-11-19 NOTE — Progress Notes (Signed)
Patient ID: Peter Elliott, male   DOB: 12-Nov-1985, 28 y.o.   MRN: [id number] Psychoeducational Group Note  Date:  11/19/2013 Time:0910am  Group Topic/Focus:  Identifying Needs:   The focus of this group is to help patients identify their personal needs that have been historically problematic and identify healthy behaviors to address their needs.  Participation Level:  Active  Participation Quality:  inappropriate  Affect:  Anxious  Cognitive:  Lacking  Insight:  Monopolizing  Engagement in Group:  Monopolizing  Additional Comments:  Inventory group   [name], Peter Elliott 11/19/2013,9:58 AM

## 2013-11-19 NOTE — Progress Notes (Signed)
Patient ID: Peter Elliott, male   DOB: 03-21-1986, 28 y.o.   MRN: [id number] 11-19-13 @ 1930 Nursing 1:1 note: D: pt has been eating his meals on the unit.he  During the meal hour he had a visitor. A: staff continue to support and encourage this patient. He was advised when his homicidal ideation and his passive SI resolves, then he could be taken off of the 1:1.  R: he is doing well with the 1:1. He continues to take his medications and is interacting on the unit. The 1:1 continues.
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
## 2013-11-19 NOTE — Progress Notes (Signed)
Patient ID: Peter Elliott, male   DOB: [birth year], 28 y.o.   MRN: [id number] Psychoeducational Group Note  Date:  11/19/2013 Time:0920am  Group Topic/Focus:  Identifying Needs:   The focus of this group is to help patients identify their personal needs that have been historically problematic and identify healthy behaviors to address their needs.  Participation Level:  Active  Participation Quality:  inappropriate.  Affect:  Anxious  Cognitive:  Lacking  Insight:  Monopolizing  Engagement in Group:  Monopolizing  Additional Comments:  Healthy coping skills.   [name], Vikkie Goeden Brooks 11/19/2013,9:59 AM

## 2013-11-19 NOTE — Progress Notes (Signed)
Patient ID: Peter Elliott, male   DOB: [birth year], 28 y.o.   MRN: [id number] 1:1 Close Observation Nursing Note: The patient is resting in bed with eyes closed. No distress noted. Close observation status maintained for safety. Will continue to monitor for safety.

## 2013-11-20 NOTE — Progress Notes (Signed)
Patient ID: Peter Elliott, male   DOB: 1986-05-02, 28 y.o.   MRN: [id number] [facility]  11/20/2013 10:45 AM Peter Elliott  MRN:  [id number]  Subjective: Patient attending groups, remains mobile and feeling somewhat better regard mood. Says not to discharge me to family. " i can be destructive"  Objective: Patient is seen and chart is reviewed. Patient is endorsing decreased mood lability, racing thoughts and sleeping better. However, he remains paranoid and grandiose and releigiously pre occupied. He is  compliant with his medications and has not verbalized any adverse reactions to her medications. He is still verbalizing passive suicidal thoughts. Says i would hurt someone if they mess with me.  Still remains paranoid today with harming thoughts especially if send back to where he was living. Poor insight. Diagnosis:   DSM5: Schizophrenia Disorders:  Delusional Disorder (297.1) Obsessive-Compulsive Disorders:   Trauma-Stressor Disorders:  Posttraumatic Stress Disorder (309.81) Substance/Addictive Disorders:   Depressive Disorders:   Total Time spent with patient: 20 minutes  Axis I: Schizoaffective disorder bipolar type           Post traumatic stress disorder  Axis II: Cluster B Traits Axis III:  Past Medical History  Diagnosis Date  . Hypertension    Axis IV: other psychosocial or environmental problems, problems related to social environment and problems with primary support group  ADL's:  Intact  Sleep: Fair  Appetite:  Fair  Suicidal Ideation: yes Plan:  denies Intent:  denies Means:  denies Homicidal Ideation:  denies AEB (as evidenced by):  Psychiatric Specialty Exam: Physical Exam  Psychiatric: His mood appears anxious. His speech is rapid and/or pressured. He is aggressive. Thought content is paranoid and delusional. Cognition and memory are normal. He expresses impulsivity. He expresses suicidal ideation.    Review of Systems  Constitutional:  Negative.   HENT: Negative.   Eyes: Negative.   Respiratory: Negative.   Cardiovascular: Negative.   Gastrointestinal: Negative.   Genitourinary: Negative.   Musculoskeletal: Negative.   Skin: Negative.   Neurological: Negative.   Endo/Heme/Allergies: Negative.   Psychiatric/Behavioral: Positive for depression and suicidal ideas. The patient is nervous/anxious.     Blood pressure 123/83, pulse 82, temperature 98 F (36.7 C), temperature source Oral, resp. rate 16, height 5' 4.5" (1.638 m), weight 103.42 kg (228 lb), SpO2 96.00%.Body mass index is 38.55 kg/(m^2).  General Appearance: fairly groomed  Patent attorneyye Contact::  Good  Speech:  Pressured  Volume:  Increased  Mood:  Angry and Irritable  Affect:  Labile and Full Range  Thought Process:  Circumstantial and Disorganized  Orientation:  Full (Time, Place, and Person)  Thought Content:  Delusions and grandiose  Suicidal Thoughts:  yes  Homicidal Thoughts:  No  Memory:  Immediate;   Fair Recent;   Fair Remote;   Fair  Judgement:  Poor  Insight:  Lacking  Psychomotor Activity:  Increased  Concentration:  Fair  Recall:  FiservFair  Fund of Knowledge:Fair  Language: Good  Akathisia:  No  Handed:  Right  AIMS (if indicated):     Assets:  Communication Skills Physical Health Social Support  Sleep:  Number of Hours: 6.75   Musculoskeletal: Strength & Muscle Tone: within normal limits Gait & Station: normal Patient leans: N/A  Current Medications: Current Facility-Administered Medications  Medication Dose Route Frequency Provider Last Rate Last Dose  . acetaminophen (TYLENOL) tablet 650 mg  650 mg Oral Q6H PRN Kerry HoughSpencer E Simon, PA-C   650 mg at 11/16/13 [id number]  .  alum & mag hydroxide-simeth (MAALOX/MYLANTA) 200-200-20 MG/5ML suspension 30 mL  30 mL Oral Q4H PRN Kerry HoughSpencer E Simon, PA-C      . hydrOXYzine (ATARAX/VISTARIL) tablet 25 mg  25 mg Oral Q6H PRN Kerry HoughSpencer E Simon, PA-C   25 mg at 11/17/13 2158  . loratadine (CLARITIN) tablet 10 mg   10 mg Oral Daily Mojeed Akintayo   10 mg at 11/20/13 0806  . lurasidone (LATUDA) tablet 80 mg  80 mg Oral q1800 Mojeed Akintayo   80 mg at 11/19/13 1703  . magnesium hydroxide (MILK OF MAGNESIA) suspension 30 mL  30 mL Oral Daily PRN Kerry HoughSpencer E Simon, PA-C   30 mL at 11/17/13 0841  . metoprolol tartrate (LOPRESSOR) tablet 25 mg  25 mg Oral BID Fransisca KaufmannLaura Davis, NP   25 mg at 11/20/13 0806  . neomycin-bacitracin-polymyxin (NEOSPORIN) ointment   Topical BID Fransisca KaufmannLaura Davis, NP   15 application at 11/20/13 [phone]  . OLANZapine zydis (ZYPREXA) disintegrating tablet 10 mg  10 mg Oral BID PRN Thresa RossNadeem Lorna Strother, MD   10 mg at 11/20/13 0806  . pantoprazole (PROTONIX) EC tablet 40 mg  40 mg Oral Daily Mojeed Akintayo   40 mg at 11/20/13 0806  . polyvinyl alcohol (LIQUIFILM TEARS) 1.4 % ophthalmic solution 1 drop  1 drop Both Eyes PRN Caprice KluverVinay P Saranga, MD   1 drop at 11/15/13 1323  . traZODone (DESYREL) tablet 50 mg  50 mg Oral QHS PRN Mojeed Akintayo   50 mg at 11/17/13 2158    Lab Results: No results found for this or any previous visit (from the past 48 hour(s)).  Physical Findings: AIMS CIWA:    COWS:     Treatment Plan Summary: Daily contact with patient to assess and evaluate symptoms and progress in treatment Medication management  Plan:  1. Continue crisis management and stabilization.  2. Medication management to reduce current symptoms to base line and improve the patient's level of functioning: -Continue Latuda  80 mg daily for mood/delusions. - Continue Zyprexa zydis prn for agitations -Continue Vistaril 25 mg prn for anxiety and Trazodone 50 mg for sleep.  3. Develop treatment plan to decrease risk of relapse upon discharge of psychotic symptoms and the need for readmission.  5. Group therapy to facilitate development of healthy coping skills to use for psychosis.  6. Health care follow up as needed for medical problems. Apply Neosporin ointment twice daily to staples along right neck and to wrist.    7. Discharge plan to include therapy to help patient cope with stressors.   8. Clinical social worker will explore placement in Assisted living environment. PPD to be placed today.   Medical Decision Making Problem Points:  Established problem, improving (1), Review of last therapy session (1) and Review of psycho-social stressors (1) Data Points:  Medication review and side effects.   I certify that inpatient services furnished can reasonably be expected to improve the patient's condition.   Thresa RossNadeem Kaylean Tupou, MD 11/20/2013, 10:45 AM

## 2013-11-20 NOTE — Progress Notes (Signed)
Patient ID: Peter Elliott, male   DOB: [birth year], 28 y.o.   MRN: [id number] 11-20-13 @ 1000 nursing 1:1 note: D: pt has visible in the milieu, going to groups and coming to the medication window. He is not making as many stmts today about hurting other patients as he was yesterday and is interacting with others. A: staff continue to support and encourage this patient. R: on his inventory sheet he wrote: slept well, appetite good, energy high, attention good with his depression and hopeless both at 0. He denied any SI presently. He has no physical complaints. Prior to  discharge he plans to talk with his doctor and social worker about staying in a group home. The 1:1 continues.

## 2013-11-20 NOTE — [facility] Group Notes (Signed)
[facility] LCSW Group Therapy Note  11/20/2013 / 11:15 AM   Type of Therapy and Topic: Group Therapy: Feelings Around Returning Home & Establishing a Supportive Framework and Activity to Identify signs of Improvement or Decompensation   Participation Level: None; patient was sleeping in group room with 1:1 MHT in attendance. Writer unable to awaken pt enough to get him to leave dayroom  Carney Bernatherine C Harrill, LCSW

## 2013-11-20 NOTE — Progress Notes (Signed)
[facility] Group Notes:  (Nursing/MHT/Case Management/Adjunct)  Date:  11/20/2013  Time:  8:00 p.m.   Type of Therapy:  Psychoeducational Skills  Participation Level:  Minimal  Participation Quality:  Resistant  Affect:  Angry  Cognitive:  Lacking  Insight:  None  Engagement in Group:  Resistant  Modes of Intervention:  Education  Summary of Progress/Problems: The patient spoke out of turn this evening and was curt with this Chartered loss adjusterauthor. He would only discuss what his support system will consist of (theme of the day) and that is himself. He stated that he doesn't trust anyone.   Westly PamBenjamin S Meenakshi Sazama 11/20/2013, 10:19 PM

## 2013-11-20 NOTE — Progress Notes (Signed)
Patient ID: Peter Elliott, male   DOB: 1986/03/25, 28 y.o.   MRN: [id number] Psychoeducational Group Note  Date:  11/20/2013 Time:  0915am  Group Topic/Focus:  Making Healthy Choices:   The focus of this group is to help patients identify negative/unhealthy choices they were using prior to admission and identify positive/healthier coping strategies to replace them upon discharge.  Participation Level:  Active  Participation Quality:  Intrusive  Affect:  Anxious  Cognitive:  Appropriate  Insight:  Distracting  Engagement in Group:  Distracting  Additional Comments:  Inventory group   [name], Coletta Lockner Brooks 11/20/2013,9:48 AM

## 2013-11-20 NOTE — Progress Notes (Signed)
Patient ID: Peter Elliott, Peter Elliott   DOB: 1985-08-17, 28 y.o.   MRN: [id number] 11-20-13 at 1400 nursing 1:1 note:  D: pt resting comfortable in bed with no sign or symptoms of stress. Respirations normal rate and rhythm. A: staff brought back a meal for the pt on the unit. He ate well and is taking in his fluids. No complaints of pain or any other complaints. R: safety maintained and the 1:1 continues.

## 2013-11-20 NOTE — Progress Notes (Signed)
Patient ID: Peter Elliott, male   DOB: 1986/05/20, 28 y.o.   MRN: [id number] 1;1 Nursing Note: The patient is resting in bed with eyes closed. No distress noted. Slept all night. 1:1 maintained for safety. Will continue to monitor.

## 2013-11-20 NOTE — Progress Notes (Signed)
Patient ID: Peter Elliott, male   DOB: 1986-05-05, 28 y.o.   MRN: [id number] 11-20-13 @ 18/42 D:  Mother called, see previous note. A: rn gave message to pt that mother called.R: the pt does not want to speak to the mother.

## 2013-11-20 NOTE — Progress Notes (Signed)
Patient ID: Arlee Muslimicholas Takach, male   DOB: 1986-07-21, 28 y.o.   MRN: [id number] 11-20-13 @ 1800 nursing 1:1 note: D: RN received two phone calls from the mother of this patient named michelle at [phone] and another call from the father named Gerri Sporewesley at [phone]. Both parties had the pt's code number. A: both wanted an update of the pt's status. RN told them he was on a 1:1 due to making homicidal remarks about others and has some passive SI in the past. These homicidal and suicidal remarks are decreasing, they were told, and if the subside the 1:1 will be discontinued. R: The pt continues to be improving. The patient clearly has stated that [place] and this was communicated to both parties. The mother ask for a rtn phone call and the dad told me to tell the pt "hello".  The 1:1 continues.

## 2013-11-20 NOTE — Progress Notes (Signed)
Patient ID: Peter Elliott, male   DOB: Mar 01, 1986, 28 y.o.   MRN: [id number] 1:1 Nursing Notes: The patient came to evening wrap up group. He was irritable and intrusive. Sated that he was his one support system and that he did not rely on anyone for help. Stated that the highlight of his day was getting a second helping of dinner. Refused to discuss anything else. Attempt made to give verbal support. 1:1 maintained for safety. Will continue to monitor.

## 2013-11-20 NOTE — Progress Notes (Signed)
Patient ID: Peter Elliott, male DOB: 1985/09/09, 28 y.o. MRN: [id number]  Psychoeducational Group Note  Date: 11/20/2013   Time: 0945  Group Topic/Focus: Conflict Resolution: Discussed what conflict is and positive and negative ways in which conflict can be resolved   Participation Level: Active   Participation Quality: Intrusive   Affect: Anxious   Cognitive: Appropriate   Insight: Minimal  Engagement in Group: Distracting

## 2013-11-20 NOTE — Progress Notes (Signed)
Patient ID: Peter Elliott, male   DOB: 05/10/1986, 28 y.o.   MRN: [id number] 1:1 Nursing Note: The patient is resting in bed with eyes closed. No distress noted. 1:1 maintained for safety. Will continue to monitor for safety.

## 2013-11-21 MED ORDER — LURASIDONE HCL 40 MG PO TABS
120.0000 mg | ORAL_TABLET | Freq: Every day | ORAL | Status: DC
  Administered 2013-11-21 – 2013-11-23 (×3): 120 mg via ORAL
  Filled 2013-11-21 (×2): qty 3
  Filled 2013-11-21: qty 9
  Filled 2013-11-21 (×2): qty 3

## 2013-11-21 MED ORDER — OLANZAPINE 10 MG PO TBDP: 10.0000 mg | ORAL_TABLET | Freq: Three times a day (TID) | ORAL | Status: DC | PRN

## 2013-11-21 NOTE — Progress Notes (Signed)
Patient ID: Peter Elliott, male   DOB: 1986/01/09, 28 y.o.   MRN: [id number] [facility]  11/21/2013 11:01 AM Peter Elliott  MRN:  [id number]  Subjective: "If you send me home today, I am going to beat the crap out of my father. Some people are plotting to kill me, I don't give a damn whether I die or alive.''  Objective: Patient is seen and chart is reviewed. Patient is extremely labile this morning, threatening to hurt his father if he is discharged today. Patient is easily agitated, irritable, paranoid, grandiose and religiously preoccupied. Patient still verbalizing suicidal thoughts but denies any specific plan.. However, he is reporting decreasing racing thoughts and says he has been sleeping much better. He is  compliant with his medications and has not verbalized any adverse reactions.  Diagnosis:   DSM5: Schizophrenia Disorders:  Delusional Disorder (297.1) Obsessive-Compulsive Disorders:   Trauma-Stressor Disorders:  Posttraumatic Stress Disorder (309.81) Substance/Addictive Disorders:   Depressive Disorders:   Total Time spent with patient: 20 minutes  Axis I: Schizoaffective disorder bipolar type           Post traumatic stress disorder  Axis II: Cluster B Traits Axis III:  Past Medical History  Diagnosis Date  . Hypertension    Axis IV: other psychosocial or environmental problems, problems related to social environment and problems with primary support group  ADL's:  Intact  Sleep: Fair  Appetite:  Fair  Suicidal Ideation: yes Plan:  denies Intent:  denies Means:  denies Homicidal Ideation:  denies AEB (as evidenced by):  Psychiatric Specialty Exam: Physical Exam  Psychiatric: His mood appears anxious. His speech is rapid and/or pressured. He is aggressive. Thought content is paranoid and delusional. Cognition and memory are normal. He expresses impulsivity. He expresses suicidal ideation.    Review of Systems  Constitutional: Negative.   HENT:  Negative.   Eyes: Negative.   Respiratory: Negative.   Cardiovascular: Negative.   Gastrointestinal: Negative.   Genitourinary: Negative.   Musculoskeletal: Negative.   Skin: Negative.   Neurological: Negative.   Endo/Heme/Allergies: Negative.   Psychiatric/Behavioral: Positive for depression and suicidal ideas. The patient is nervous/anxious.     Blood pressure 136/83, pulse 76, temperature 98 F (36.7 C), temperature source Oral, resp. rate 20, height 5' 4.5" (1.638 m), weight 103.42 kg (228 lb), SpO2 96.00%.Body mass index is 38.55 kg/(m^2).  General Appearance: fairly groomed  Patent attorneyye Contact::  Good  Speech:  Pressured  Volume:  Increased  Mood:  Angry and Irritable  Affect:  Labile and Full Range  Thought Process:  Circumstantial and Disorganized  Orientation:  Full (Time, Place, and Person)  Thought Content:  Delusions and grandiose  Suicidal Thoughts:  yes  Homicidal Thoughts:  No  Memory:  Immediate;   Fair Recent;   Fair Remote;   Fair  Judgement:  Poor  Insight:  Lacking  Psychomotor Activity:  Increased  Concentration:  Fair  Recall:  FiservFair  Fund of Knowledge:Fair  Language: Good  Akathisia:  No  Handed:  Right  AIMS (if indicated):     Assets:  Communication Skills Physical Health Social Support  Sleep:  Number of Hours: 6.75   Musculoskeletal: Strength & Muscle Tone: within normal limits Gait & Station: normal Patient leans: N/A  Current Medications: Current Facility-Administered Medications  Medication Dose Route Frequency Provider Last Rate Last Dose  . acetaminophen (TYLENOL) tablet 650 mg  650 mg Oral Q6H PRN Peter HoughSpencer E Simon, PA-C   650 mg at 11/16/13  [id number]  . alum & mag hydroxide-simeth (MAALOX/MYLANTA) 200-200-20 MG/5ML suspension 30 mL  30 mL Oral Q4H PRN Peter HoughSpencer E Simon, PA-C      . hydrOXYzine (ATARAX/VISTARIL) tablet 25 mg  25 mg Oral Q6H PRN Peter HoughSpencer E Simon, PA-C   25 mg at 11/17/13 2158  . loratadine (CLARITIN) tablet 10 mg  10 mg Oral Daily  Peter Elliott   10 mg at 11/21/13 0823  . lurasidone (LATUDA) tablet 120 mg  120 mg Oral q1800 Peter Elliott      . magnesium hydroxide (MILK OF MAGNESIA) suspension 30 mL  30 mL Oral Daily PRN Peter HoughSpencer E Simon, PA-C   30 mL at 11/17/13 0841  . metoprolol tartrate (LOPRESSOR) tablet 25 mg  25 mg Oral BID Peter KaufmannLaura Davis, Peter Elliott   25 mg at 11/21/13 [id number]  . neomycin-bacitracin-polymyxin (NEOSPORIN) ointment   Topical BID Peter KaufmannLaura Davis, Peter Elliott   15 application at 11/21/13 1038  . OLANZapine zydis (ZYPREXA) disintegrating tablet 10 mg  10 mg Oral TID PRN Peter Elliott      . pantoprazole (PROTONIX) EC tablet 40 mg  40 mg Oral Daily Peter Elliott   40 mg at 11/21/13 0823  . polyvinyl alcohol (LIQUIFILM TEARS) 1.4 % ophthalmic solution 1 drop  1 drop Both Eyes PRN Peter KluverVinay P Saranga, MD   1 drop at 11/15/13 1323  . traZODone (DESYREL) tablet 50 mg  50 mg Oral QHS PRN Peter Elliott   50 mg at 11/17/13 2158    Lab Results: No results found for this or any previous visit (from the past 48 hour(s)).  Physical Findings: AIMS CIWA:    COWS:     Treatment Plan Summary: Daily contact with patient to assess and evaluate symptoms and progress in treatment Medication management  Plan:  1. Continue crisis management and stabilization.  2. Medication management to reduce current symptoms to base line and improve the patient's level of functioning: -Increase  Latuda to 120 mg daily for mood/delusions. - Continue Zyprexa zydis 10mg  tid prn for agitations -Continue Vistaril 25 mg prn for anxiety and Trazodone 50 mg for sleep.  3. Develop treatment plan to decrease risk of relapse upon discharge of psychotic symptoms and the need for readmission.  5. Group therapy to facilitate development of healthy coping skills to use for psychosis.  6. Health care follow up as needed for medical problems. Apply Neosporin ointment twice daily to staples along right neck and to wrist.   7. Discharge plan to include therapy to help  patient cope with stressors.   8. Clinical social worker will explore placement in Assisted living environment. PPD to be placed today.   Medical Decision Making Problem Points:  Established problem, improving (1), Review of last therapy session (1) and Review of psycho-social stressors (1) Data Points:  Medication review and side effects.   I certify that inpatient services furnished can reasonably be expected to improve the patient's condition.   Thedore MinsMojeed Leondra Cullin, MD 11/21/2013, 11:01 AM

## 2013-11-21 NOTE — Progress Notes (Addendum)
[facility] Post 1:1 Observation Documentation  For the first (8) hours following discontinuation of 1:1 precautions, a progress note entry by nursing staff should be documented at least every 2 hours, reflecting the patient's behavior, condition, mood, and conversation.  Use the progress notes for additional entries.  Time 1:1 discontinued:  0920 am  Patient's Behavior:  Animated and hyperverbal   Patient's Condition:  Patient's physically stable.  He is expressing homicidal statements about his father. Patient also making threatening statements to staff.  Patient's mood is labile.  Patient's Conversation:  Patient states, "I really wouldn't hurt anyone.  You know I don't mean it."   Cresenciano Genrearoline Evans Kyelle Urbas 11/21/2013, 3:01 PM

## 2013-11-21 NOTE — [facility] Group Notes (Signed)
[facility] LCSW Group Therapy  11/21/2013 1:15 pm  Type of Therapy: Process Group Therapy  Participation Level:  On his way into group, Weston Brassick began ranting about how he refuses to go home to his father's house.  Even after I told him I understood and it is his decision to make, he would not relent.  I told him he needed to stop when everyone was gathered for group.  He stopped momentarily, then continued 3 minutes later.  Again told him I had heard him, and he needed to allow group to transpire. He soon left.   Summary of Progress/Problems: Today's group addressed the issue of overcoming obstacles.  Patients were asked to identify their biggest obstacle post d/c that stands in the way of their on-going success, and then problem solve as to how to manage this.  Daryel Geraldorth, Vang Kraeger B 11/21/2013   3:01 PM

## 2013-11-21 NOTE — Progress Notes (Signed)
[place] Post 1:1 Observation Documentation  For the first (8) hours following discontinuation of 1:1 precautions, a progress note entry by nursing staff should be documented at least every 2 hours, reflecting the patient's behavior, condition, mood, and conversation.  Use the progress notes for additional entries.  Time 1:1 discontinued:  0920 am  Patient's Behavior:  Patient attending groups and interacting well with staff and others.  Patient's Condition:  Patient physically stable.  Mood is elevated and patient is grandious.   Patient's Conversation: Patient met with nurse and talked about revelations.  He stated he knows when it is coming.  He states he is not muslim, but islamic and looks to [employer].    Arman Bogus Meriam Chojnowski 11/21/2013, 3:20 PM

## 2013-11-21 NOTE — [facility] Group Notes (Signed)
[facility]   11/21/2013 10:22 AM  Participation Quality:  Engaged  Mood/Affect:  Irritable and Labile  Depression Rating:    Anxiety Rating:    Thoughts of Suicide:  No Will you contract for safety?   NA  Current AVH:  No  Plan for Discharge/Comments:  [name] escalated quickly after being asked why he was on a 1:1.  Stated that he wants to have sex with "whoever I want to have sex with.  My father can't tell me what to do.  I will murder his ass."  Was able to calm down temporarily to talk about a plan that involves paying off his debts and getting his own place.  Also said he would not be agreeable to go to a Group Home.  "That was my mother's lame idea."  Then re-escalated.  Transportation Means: unk  Supports: family  Ida RogueRodney B Ervey Fallin

## 2013-11-21 NOTE — Progress Notes (Signed)
Patient ID: Peter Elliott, male   DOB: 08/14/1985, 27 y.o.   MRN: [id number] 1:1 Nursing Notes: The patient is resting in bed with eyes closed. No distress noted. 1:1 maintained for safety. Will continue to monitor. 

## 2013-11-21 NOTE — Progress Notes (Signed)
Patient ID: Peter Elliott, male   DOB: [birth year], 28 y.o.   MRN: [id number] D: Patient continues to exhibit irritability and impulsiveness.  He is animated with rapid, pressured speech. Patient met with nurse and MD and stated if he goes back to his father's home, he will "beat the s$$$ out of him."  Patient stated this morning during med pass that he was not suicidal, then spoke to the doctor about his suicide attempt and a girl at [facility] had told him he would do it.  He states, "there are no MF voices and people were talking about raping me!"  "I swear if I had a gun, I'd shot ya'll" (indicating nurse and MD). Patient escalated to the point, he stood up and stormed out of consult room and slammed the door. Later, in the hallway he apologized to nurse and asked if she would apologize to the doctor for him.  He stated, "you know I would never hurt anyone."  Patient states that he does not want to return to his dad's home.  When asked where he will go, he stated, "I really don't know."  Suggested to patient to talk with CW regarding his discharge status and follow up.  Patient states he's too suicidal to leave.  A: Continue to monitor patient's behavior and redirect as necessary.  1:1 discontinued today with a possible discharge tomorrow.  R: Patient is redirectable at this time.

## 2013-11-21 NOTE — Progress Notes (Signed)
Adult Psychoeducational Group Note  Date:  11/21/2013 Time:  9:10 PM  Group Topic/Focus:  Wrap-Up Group:   The focus of this group is to help patients review their daily goal of treatment and discuss progress on daily workbooks.  Participation Level:  Active  Participation Quality:  Intrusive  Affect:  Labile  Cognitive:  Lacking  Insight: Limited  Engagement in Group:  Engaged  Modes of Intervention:  Education, Exploration, Socialization and Support  Additional Comments:  Patient attended and participated in group tonight. The patient reports that he had a bad day today. He became mad at his doctor today. He became threatening and felt as though he was going to hit his doctor so he got up and walk away.  Ramses Klecka Fabiola BackerDacosta Alanee Ting 11/21/2013, 9:10 PM

## 2013-11-21 NOTE — Progress Notes (Signed)
[facility] Post 1:1 Observation Documentation  For the first (8) hours following discontinuation of 1:1 precautions, a progress note entry by nursing staff should be documented at least every 2 hours, reflecting the patient's behavior, condition, mood, and conversation.  Use the progress notes for additional entries.  Time 1:1 discontinued:  0920 am  Patient's Behavior:  Patient is calmer, however, is worried about where he will go upon discharge.  Patient's Condition:  Patient physically stable.  He remains hyperverbal with disorganized thought processes.   Patient's Conversation:  Patient continues to verbalize suicidal thoughts and paranoia.  Patient is convinced someone was out to rape him.  Peter Elliott 11/21/2013, 3:07 PM

## 2013-11-21 NOTE — Progress Notes (Signed)
[facility] Post 1:1 Observation Documentation  For the first (8) hours following discontinuation of 1:1 precautions, a progress note entry by nursing staff should be documented at least every 2 hours, reflecting the patient's behavior, condition, mood, and conversation.  Use the progress notes for additional entries.  Time 1:1 discontinued:  0920 am  Patient's Behavior:  Calm and cooperative  Patient's Condition:  Physically stable.  Labile mood.  Patient's Conversation:  Patient joking around with staff and peers.  Cresenciano GenreCaroline Evans Francies Inch 11/21/2013, 6:49 PM

## 2013-11-21 NOTE — Progress Notes (Signed)
[facility] Post 1:1 Observation Documentation  For the first (8) hours following discontinuation of 1:1 precautions, a progress note entry by nursing staff should be documented at least every 2 hours, reflecting the patient's behavior, condition, mood, and conversation.  Use the progress notes for additional entries.  Time 1:1 discontinued:  0920 am   Patient's Behavior:  Patient pacing the hallways.  He is restless.  Patient's Condition:  Patient physically stable.  His mood is elevated.  Patient is joking and laughing.  Patient's Conversation:  Patient exhibits attention seeking behaviors.  He has expressed his desire to stay here until he is well.   Cresenciano GenreCaroline Evans Chrishon Martino 11/21/2013, 3:23 PM

## 2013-11-21 NOTE — Progress Notes (Signed)
Patient ID: Peter Elliott, male   DOB: [birth year], 28 y.o.   MRN: [id number] 1:1 Nursing Notes: The patient is resting in bed with eyes closed. No distress noted. 1:1 maintained for safety. Will continue to monitor.

## 2013-11-22 NOTE — [facility] Group Notes (Signed)
[facility] LCSW Group Therapy  11/22/2013 , 12:52 PM   Type of Therapy:  Group Therapy  Participation Level:  Did not attend  Summary of Progress/Problems: Today's group focused on the term Diagnosis.  Participants were asked to define the term, and then pronounce whether it is a negative, positive or neutral term.  Daryel Geraldorth, Aisley Whan B 11/22/2013 , 12:52 PM

## 2013-11-22 NOTE — Progress Notes (Signed)
Patient ID: Peter Elliott, male   DOB: Oct 13, 1985, 28 y.o.   MRN: [id number] D: patient continues to be easily agitated.  His mood continues to be labile.  Patient became upset at some of his peers at rec time stating, "they said I was gay."  In group this morning, patient made it clear of his sexual orientation and attempted to talk about sex.  Patient had to be redirected as he was talking inappropriately and monopolizing the group.  Patient became upset with nurse at medication time because he asked, "I think I need to be a 1 on 1."  Explained to patient that at this moment he did not warrant and 1:1, plus he was taken off a 1:1 yesterday because of appropriate behavior.  Patient states that he is passively suicidal.  He continues to verbalize HI toward his father.  He asked staff to take his mother and dad off the consent list because he didn't want any information going to them regarding his care.  His mother called in tears and asked how could we let a patient go if the family was against it.  She is very concerned about patient being discharged.  Explained to mother that as long as he didn't want me to give out information, I could not.  I also informed CW that patient had requested no information given to his family.  Patient then requested approximately an hour later to put his mother and dad back on the list because, "they will get mad at me."  State rep came to interview patient for PASSR number and patient became upset.  He went to the his room and proceeded to pick at the stitches on his neck.  He was sobbing uncontrollably stating, "they upset me.  They called me gay."  Patient has pulled a scab off one of the stitches, but it remains intact.  His incision was cleaned and patient was asked not to pull on the scab.  Discussed 1:1 with AC and decision was to not make patient a 1:1 at this patient.  Patient has been asked to sit in they day room so staff can observe him and divert any self harm behaviors.   A: continue to monitor patient's behavior and redirect as necessary.  Safety checks completed every 15 minutes per protocol.  R: patient redirectable at this time.

## 2013-11-22 NOTE — [facility] Group Notes (Signed)
Adult Psychoeducational Group Note  Date:  11/22/2013 Time:  8:37 PM  Group Topic/Focus:  Wrap-Up Group:   The focus of this group is to help patients review their daily goal of treatment and discuss progress on daily workbooks.  Participation Level:  Did Not Attend  Participation Quality:  None  Affect:  None  Cognitive:  None  Insight: None  Engagement in Group:  None  Modes of Intervention:  Discussion  Additional Comments:  [name] did not attend group.  [name] [name] 11/22/2013, 8:37 PM

## 2013-11-22 NOTE — [facility] Group Notes (Signed)
[facility] Group Notes:    Date:  11/22/2013  Time:  9:37 AM  Type of Therapy:  Psychoeducational Skills  Participation Level:  Active  Participation Quality:  Appropriate  Affect:  Appropriate  Cognitive:  Alert  Insight:  Appropriate  Engagement in Group:  Engaged  Modes of Intervention:  Discussion  Summary of Progress/Problems:Pt stated he is not sure where he will gom upon discharge-he was monopolizing  Nicole CellaJanet Guyes Peter Elliott 11/22/2013, 9:37 AM

## 2013-11-22 NOTE — Progress Notes (Signed)
Patient ID: Peter Elliott, male   DOB: 12-Nov-1985, [age] y.o.   MRN: [id number] [facility]  11/22/2013 1:32 PM Peter Muslimicholas Circle  MRN:  [id number]  Subjective: Patient states "I'm not as angry today. I don't like when someone says I hear voices when I don't. I was just running my mouth yesterday when I threatened staff. I would do something supernatural to hurt someone. I have special powers."  Objective: Patient is seen and chart is reviewed. Patient is easily agitated, irritable, paranoid, grandiose and religiously preoccupied.  He is compliant with his medications and has not verbalized any adverse reactions. Patient is attending the scheduled groups but must be redirected from monopolizing. His mood remains very labile. Yesterday the patient threatened to shoot the MD and RN. Today he minimizes this insisting that he was only angry over something that was said to him. Patient continues to be delusional over having special powers. He again reports that he prayed to Allah and Jehovah before his suicide attempt to ensure that he would not die. The patient remains compliant with his scheduled medications and denies any adverse effects.   Diagnosis:   DSM5: Schizophrenia Disorders:  Delusional Disorder (297.1) Obsessive-Compulsive Disorders:   Trauma-Stressor Disorders:  Posttraumatic Stress Disorder (309.81) Substance/Addictive Disorders:   Depressive Disorders:   Total Time spent with patient: 20 minutes  Axis I: Schizoaffective disorder bipolar type           Post traumatic stress disorder  Axis II: Cluster B Traits Axis III:  Past Medical History  Diagnosis Date  . Hypertension    Axis IV: other psychosocial or environmental problems, problems related to social environment and problems with primary support group  ADL's:  Intact  Sleep: Fair  Appetite:  Fair  Suicidal Ideation: yes Plan:  denies Intent:  denies Means:  denies Homicidal Ideation:  denies AEB (as evidenced  by):  Psychiatric Specialty Exam: Physical Exam  Psychiatric: His mood appears anxious. His speech is rapid and/or pressured. He is aggressive. Thought content is paranoid and delusional. Cognition and memory are normal. He expresses impulsivity. He expresses suicidal ideation.    Review of Systems  Constitutional: Negative.   HENT: Negative.   Eyes: Negative.   Respiratory: Negative.   Cardiovascular: Negative.   Gastrointestinal: Negative.   Genitourinary: Negative.   Musculoskeletal: Negative.   Skin: Negative.   Neurological: Negative.   Endo/Heme/Allergies: Negative.   Psychiatric/Behavioral: Positive for depression and suicidal ideas. Negative for hallucinations, memory loss and substance abuse. The patient is nervous/anxious. The patient does not have insomnia.     Blood pressure 122/83, pulse 87, temperature 98 F (36.7 C), temperature source Oral, resp. rate 16, height 5' 4.5" (1.638 m), weight 103.42 kg (228 lb), SpO2 96.00%.Body mass index is 38.55 kg/(m^2).  General Appearance: fairly groomed  Patent attorneyye Contact::  Good  Speech:  Pressured  Volume:  Increased  Mood:  Angry and Irritable  Affect:  Labile and Full Range  Thought Process:  Circumstantial and Disorganized  Orientation:  Full (Time, Place, and Person)  Thought Content:  Delusions and grandiose  Suicidal Thoughts:  Yes but contracts for safety   Homicidal Thoughts:  No  Memory:  Immediate;   Fair Recent;   Fair Remote;   Fair  Judgement:  Poor  Insight:  Lacking  Psychomotor Activity:  Increased  Concentration:  Fair  Recall:  [employer]Fair  Fund of Knowledge:Fair  Language: Good  Akathisia:  No  Handed:  Right  AIMS (if indicated):  Assets:  Manufacturing systems engineerCommunication Skills Physical Health Social Support  Sleep:  Number of Hours: 6.75   Musculoskeletal: Strength & Muscle Tone: within normal limits Gait & Station: normal Patient leans: N/A  Current Medications: Current Facility-Administered Medications   Medication Dose Route Frequency Provider Last Rate Last Dose  . acetaminophen (TYLENOL) tablet 650 mg  650 mg Oral Q6H PRN Kerry HoughSpencer E Simon, PA-C   650 mg at 11/16/13 [id number]  . alum & mag hydroxide-simeth (MAALOX/MYLANTA) 200-200-20 MG/5ML suspension 30 mL  30 mL Oral Q4H PRN Kerry HoughSpencer E Simon, PA-C      . hydrOXYzine (ATARAX/VISTARIL) tablet 25 mg  25 mg Oral Q6H PRN Kerry HoughSpencer E Simon, PA-C   25 mg at 11/17/13 2158  . loratadine (CLARITIN) tablet 10 mg  10 mg Oral Daily Dannette Kinkaid   10 mg at 11/22/13 0811  . lurasidone (LATUDA) tablet 120 mg  120 mg Oral q1800 Joie Hipps   120 mg at 11/21/13 1836  . magnesium hydroxide (MILK OF MAGNESIA) suspension 30 mL  30 mL Oral Daily PRN Kerry HoughSpencer E Simon, PA-C   30 mL at 11/17/13 0841  . metoprolol tartrate (LOPRESSOR) tablet 25 mg  25 mg Oral BID Fransisca KaufmannLaura Davis, NP   25 mg at 11/22/13 [id number]  . neomycin-bacitracin-polymyxin (NEOSPORIN) ointment   Topical BID Fransisca KaufmannLaura Davis, NP   15 application at 11/22/13 [phone]  . OLANZapine zydis (ZYPREXA) disintegrating tablet 10 mg  10 mg Oral TID PRN Sundeep Cary      . pantoprazole (PROTONIX) EC tablet 40 mg  40 mg Oral Daily Nandika Stetzer   40 mg at 11/22/13 0811  . polyvinyl alcohol (LIQUIFILM TEARS) 1.4 % ophthalmic solution 1 drop  1 drop Both Eyes PRN Caprice KluverVinay P Saranga, MD   1 drop at 11/15/13 1323  . traZODone (DESYREL) tablet 50 mg  50 mg Oral QHS PRN Mylin Hirano   50 mg at 11/17/13 2158    Lab Results: No results found for this or any previous visit (from the past 48 hour(s)).  Physical Findings: AIMS CIWA:    COWS:     Treatment Plan Summary: Daily contact with patient to assess and evaluate symptoms and progress in treatment Medication management  Plan:  1. Continue crisis management and stabilization.  2. Medication management to reduce current symptoms to base line and improve the patient's level of functioning: -Continue Latuda 120 mg daily for mood/delusions. - Continue Zyprexa zydis 10mg  tid  prn for agitations -Continue Vistaril 25 mg prn for anxiety and Trazodone 50 mg for sleep.  3. Develop treatment plan to decrease risk of relapse upon discharge of psychotic symptoms and the need for readmission.  5. Group therapy to facilitate development of healthy coping skills to use for psychosis.  6. Health care follow up as needed for medical problems.  7. Discharge plan to include therapy to help patient cope with stressors.    Medical Decision Making Problem Points:  Established problem, improving (1), Review of last therapy session (1) and Review of psycho-social stressors (1) Data Points:   Review or order clinical lab tests (1)  Review of medication regiment & side effects (2)  Review of new medications or change in dosage (2)  I certify that inpatient services furnished can reasonably be expected to improve the patient's condition.   Fransisca KaufmannLaura Davis, NP-C 11/22/2013, 1:32 PM  Patient seen, evaluated and I agree with notes by Nurse Practitioner. Thedore MinsMojeed Sherina Stammer, MD

## 2013-11-23 ENCOUNTER — Encounter: Payer: Self-pay | Admitting: Vascular Surgery

## 2013-11-23 NOTE — Tx Team (Signed)
  Interdisciplinary Treatment Plan Update   Date Reviewed:  11/23/2013  Time Reviewed:  10:43 AM  Progress in Treatment:   Attending groups: Yes Participating in groups: Yes Taking medication as prescribed: Yes  Tolerating medication: Yes Family/Significant other contact made: Yes  Patient understands diagnosis: Yes  Discussing patient identified problems/goals with staff: Yes Medical problems stabilized or resolved: Yes Denies suicidal/homicidal ideation: Yes Patient has not harmed self or others: Yes  For review of initial/current patient goals, please see plan of care.  Estimated Length of Stay:  D/C tomorrow  Reason for Continuation of Hospitalization:   New Problems/Goals identified:  N/A  Discharge Plan or Barriers:   return home, follow up outpt  Additional Comments:  Attendees:  Signature: Thedore MinsMojeed Akintayo, MD 11/23/2013 10:43 AM   Signature: Richelle Itood Sharifah Champine, LCSW 11/23/2013 10:43 AM  Signature: Fransisca KaufmannLaura Davis, NP 11/23/2013 10:43 AM  Signature: Joslyn Devonaroline Beaudry, RN 11/23/2013 10:43 AM  Signature: Liborio NixonPatrice White, RN 11/23/2013 10:43 AM  Signature:  11/23/2013 10:43 AM  Signature:   11/23/2013 10:43 AM  Signature:    Signature:    Signature:    Signature:    Signature:    Signature:      Scribe for Treatment Team:   Richelle Itood Demetrio Leighty, LCSW  11/23/2013 10:43 AM

## 2013-11-23 NOTE — Progress Notes (Signed)
[facility]  Will you be returning to the same living situation after discharge: Yes,  home At discharge, do you have transportation home?:Yes,  father Do you have the ability to pay for your medications:Yes,  MCD  Release of information consent forms completed and in the chart;  Patient's signature needed at discharge.  Patient to Follow up at: Follow-up Information   [facility] ([facility]    Contact information:   Peter Regalarol (Triage RN) w/[facility] called [phone]. Patient is scheduled for follow-up with Peter Brunsharles Fields MD Thursday April 30th at 1:45 p.m.      Follow up with [facility]  On 11/25/2013. (Go to walk-in clinic between 8-11:30am for your intake appointment.  )    Contact information:   [address] [place] [id number] [phone]      Patient denies SI/HI:   Yes,  yes    Safety Planning and Suicide Prevention discussed:  Yes,  yes  Peter Elliott 11/23/2013, 11:37 AM

## 2013-11-23 NOTE — Progress Notes (Signed)
D: Pt denies SI, HI, AH and VH.  Pt. Observed to have bright affect.  Pt's observed mood is positive.  Pt observed to be talkative with staff.  A: Patient given emotional support from RN. Patient encouraged to come to staff with concerns and/or questions. Patient's medication routine continued. Patient's orders and plan of care reviewed.   R: Patient remains appropriate and cooperative. Will continue to monitor patient q15 minutes for safety.

## 2013-11-23 NOTE — [facility] Group Notes (Signed)
[facility] Group Therapy  11/23/2013  12:27 PM  Type of Therapy:  [facility] Presentation   Participation Level:  Did not attend.     Summary of Progress/Problems:  [name] from [facility] [employer] [facility] came to present his recovery story and play the guitar.  Tina Yang   11/23/2013  12:27 PM  

## 2013-11-23 NOTE — Progress Notes (Signed)
Patient ID: Peter Elliott, male   DOB: 08/30/1985, 28 y.o.   MRN: [id number]   D: Pt slept thru the entire shift waking up this morning. Stated that he believes the mht put "something in his tea". Writer assured the pt that any meds given would've been announced by the RN. Writer asked pt to explain the conversation he had with his treatment. Pt stated he met with "[name]" a person that's responsible for placing him in an apt. Stated that she wants him to undergo testing. Pt stated "I don't know what the testing is for". Pt also instructed the writer to see how long he would have to live with his father before moving into the apt. Pt also wanted writer to make sure that he is demanding to be able to have sex whenever he wants, and any visitor of his choice.  A:  Support and encouragement was offered. 15 min checks continued for safety.  R: Pt remains safe.

## 2013-11-23 NOTE — Progress Notes (Signed)
Patient ID: Peter Elliott, male   DOB: 05-Sep-1985, 28 y.o.   MRN: [id number] [facility]  11/23/2013 10:07 AM Peter Muslimicholas Taitt  MRN:  [id number]  Subjective: "I think I am doing well on my current . I have changed my mind not to go to the shelter, I will go home with my father instead whenever you discharged me.''  Objective: Patient is seen and chart is reviewed. Patient reports decreased mood swings, irritability and agitation. He denies suicidal/homicidal ideation, intent or plan. However, he remains grandiose saying that he predicted the [place] that happened [place] ago.  He is  compliant with his medications and has not verbalized any adverse reactions.  Diagnosis:   DSM5: Schizophrenia Disorders:  Delusional Disorder (297.1) Obsessive-Compulsive Disorders:   Trauma-Stressor Disorders:  Posttraumatic Stress Disorder (309.81) Substance/Addictive Disorders:   Depressive Disorders:   Total Time spent with patient: 20 minutes  Axis I: Schizoaffective disorder bipolar type           Post traumatic stress disorder  Axis II: Cluster B Traits Axis III:  Past Medical History  Diagnosis Date  . Hypertension    Axis IV: other psychosocial or environmental problems, problems related to social environment and problems with primary support group  ADL's:  Intact  Sleep: Fair  Appetite:  Fair  Suicidal Ideation:  Plan:  denies Intent:  denies Means:  denies Homicidal Ideation:  denies AEB (as evidenced by):  Psychiatric Specialty Exam: Physical Exam  Psychiatric: His speech is rapid and/or pressured. Thought content is delusional. Cognition and memory are normal. He expresses impulsivity.    Review of Systems  Constitutional: Negative.   HENT: Negative.   Eyes: Negative.   Respiratory: Negative.   Cardiovascular: Negative.   Gastrointestinal: Negative.   Genitourinary: Negative.   Musculoskeletal: Negative.   Skin: Negative.   Neurological: Negative.    Endo/Heme/Allergies: Negative.   Psychiatric/Behavioral: Negative.     Blood pressure 117/76, pulse 76, temperature 97.3 F (36.3 C), temperature source Oral, resp. rate 20, height 5' 4.5" (1.638 m), weight 103.42 kg (228 lb), SpO2 96.00%.Body mass index is 38.55 kg/(m^2).  General Appearance: fairly groomed  Patent attorneyye Contact::  Good  Speech:  Pressured  Volume:  Normal  Mood:  Angry and Irritable  Affect: Full Range  Thought Process:  Circumstantial   Orientation:  Full (Time, Place, and Person)  Thought Content:  Delusions and grandiose  Suicidal Thoughts:  denies  Homicidal Thoughts:  No  Memory:  Immediate;   Fair Recent;   Fair Remote;   Fair  Judgement: marginal  Insight:  Shallow  Psychomotor Activity:  normal  Concentration:  Fair  Recall:  [employer]Fair  Fund of Knowledge:Fair  Language: Good  Akathisia:  No  Handed:  Right  AIMS (if indicated):     Assets:  Communication Skills Physical Health Social Support  Sleep:  Number of Hours: 6.75   Musculoskeletal: Strength & Muscle Tone: within normal limits Gait & Station: normal Patient leans: N/A  Current Medications: Current Facility-Administered Medications  Medication Dose Route Frequency Provider Last Rate Last Dose  . acetaminophen (TYLENOL) tablet 650 mg  650 mg Oral Q6H PRN Kerry HoughSpencer E Simon, PA-C   650 mg at 11/16/13 [id number]  . alum & mag hydroxide-simeth (MAALOX/MYLANTA) 200-200-20 MG/5ML suspension 30 mL  30 mL Oral Q4H PRN Kerry HoughSpencer E Simon, PA-C      . hydrOXYzine (ATARAX/VISTARIL) tablet 25 mg  25 mg Oral Q6H PRN Kerry HoughSpencer E Simon, PA-C  25 mg at 11/17/13 2158  . loratadine (CLARITIN) tablet 10 mg  10 mg Oral Daily Emilea Goga   10 mg at 11/23/13 0757  . lurasidone (LATUDA) tablet 120 mg  120 mg Oral q1800 Trang Bouse   120 mg at 11/22/13 1720  . magnesium hydroxide (MILK OF MAGNESIA) suspension 30 mL  30 mL Oral Daily PRN Kerry HoughSpencer E Simon, PA-C   30 mL at 11/17/13 0841  . metoprolol tartrate (LOPRESSOR) tablet  25 mg  25 mg Oral BID Fransisca KaufmannLaura Davis, NP   25 mg at 11/23/13 0756  . neomycin-bacitracin-polymyxin (NEOSPORIN) ointment   Topical BID Fransisca KaufmannLaura Davis, NP   15 application at 11/23/13 0759  . OLANZapine zydis (ZYPREXA) disintegrating tablet 10 mg  10 mg Oral TID PRN Muneer Leider      . pantoprazole (PROTONIX) EC tablet 40 mg  40 mg Oral Daily Kaaliyah Kita   40 mg at 11/23/13 0756  . polyvinyl alcohol (LIQUIFILM TEARS) 1.4 % ophthalmic solution 1 drop  1 drop Both Eyes PRN Caprice KluverVinay P Saranga, MD   1 drop at 11/15/13 1323  . traZODone (DESYREL) tablet 50 mg  50 mg Oral QHS PRN Mikhael Hendriks   50 mg at 11/17/13 2158    Lab Results: No results found for this or any previous visit (from the past 48 hour(s)).  Physical Findings: AIMS CIWA:    COWS:     Treatment Plan Summary: Daily contact with patient to assess and evaluate symptoms and progress in treatment Medication management  Plan:  1. Continue crisis management and stabilization.  2. Medication management to reduce current symptoms to base line and improve the patient's level of functioning: -Continue  Latuda  120 mg daily for mood/delusions. - Continue Zyprexa zydis 10mg  tid prn for agitations -Continue Vistaril 25 mg prn for anxiety and Trazodone 50 mg for sleep.  3. Develop treatment plan to decrease risk of relapse upon discharge of psychotic symptoms and the need for readmission.  5. Group therapy to facilitate development of healthy coping skills to use for psychosis.  6. Health care follow up as needed for medical problems. Apply Neosporin ointment twice daily to staples along right neck and to wrist.   7. Discharge plan to include therapy to help patient cope with stressors.   8. Clinical social worker will explore placement in Assisted living environment. PPD to be placed today.   Medical Decision Making Problem Points:  Established problem, improving (1), Review of last therapy session (1) and Review of psycho-social stressors  (1) Data Points:  Medication review and side effects.   I certify that inpatient services furnished can reasonably be expected to improve the patient's condition.   Thedore MinsMojeed Catheryn Slifer, MD 11/23/2013, 10:07 AM

## 2013-11-23 NOTE — Progress Notes (Signed)
Patient ID: Peter Elliott, male   DOB: [birth year], 28 y.o.   MRN: [id number] D: Patient sleeping this evening. Respiration regular and unlabored. No sign of distress noted at this time A: 15 mins checks for safety. R: Patient  is safe.

## 2013-11-23 NOTE — Progress Notes (Signed)
The focus of this group is to help patients review their daily goal of treatment and discuss progress on daily workbooks. Pt did not attend the evening group. 

## 2013-11-24 ENCOUNTER — Encounter: Payer: Self-pay | Admitting: Vascular Surgery

## 2013-11-24 ENCOUNTER — Ambulatory Visit (INDEPENDENT_AMBULATORY_CARE_PROVIDER_SITE_OTHER): Payer: Medicaid Other | Admitting: Vascular Surgery

## 2013-11-24 VITALS — BP 132/77 | HR 83 | Ht 67.0 in | Wt 244.0 lb

## 2013-11-24 DIAGNOSIS — T148XXA Other injury of unspecified body region, initial encounter: Secondary | ICD-10-CM

## 2013-11-24 MED ORDER — TRAZODONE HCL 50 MG PO TABS: 50.0000 mg | ORAL_TABLET | Freq: Every evening | ORAL | Status: DC | PRN

## 2013-11-24 MED ORDER — PANTOPRAZOLE SODIUM 40 MG PO TBEC: 40.0000 mg | DELAYED_RELEASE_TABLET | Freq: Every day | ORAL | Status: DC

## 2013-11-24 MED ORDER — LURASIDONE HCL 120 MG PO TABS: 120.0000 mg | ORAL_TABLET | Freq: Every day | ORAL | Status: DC

## 2013-11-24 MED ORDER — METOPROLOL TARTRATE 25 MG PO TABS: 25.0000 mg | ORAL_TABLET | Freq: Two times a day (BID) | ORAL | Status: DC

## 2013-11-24 NOTE — [facility] Suicide Risk Assessment (Signed)
   Demographic Factors:  Male, Caucasian, Low socioeconomic status, Unemployed and lives with his father  Total Time spent with patient: 20 minutes  Psychiatric Specialty Exam: Physical Exam  Psychiatric: He has a normal mood and affect. His speech is normal and behavior is normal. Judgment and thought content normal. Cognition and memory are normal.    Review of Systems  Constitutional: Negative.   HENT: Negative.   Eyes: Negative.   Respiratory: Negative.   Cardiovascular: Negative.   Gastrointestinal: Negative.   Genitourinary: Negative.   Musculoskeletal: Negative.   Skin: Negative.   Neurological: Negative.   Endo/Heme/Allergies: Negative.   Psychiatric/Behavioral: Negative.     Blood pressure 117/83, pulse 73, temperature 98.2 F (36.8 C), temperature source Oral, resp. rate 16, height 5' 4.5" (1.638 m), weight 103.42 kg (228 lb), SpO2 96.00%.Body mass index is 38.55 kg/(m^2).  General Appearance: Fairly Groomed  Patent attorneyye Contact::  Negative  Speech:  Clear and Coherent and Normal Rate  Volume:  Normal  Mood:  Euthymic  Affect:  Appropriate  Thought Process:  Goal Directed  Orientation:  Full (Time, Place, and Person)  Thought Content:  Negative  Suicidal Thoughts:  No  Homicidal Thoughts:  No  Memory:  Immediate;   Fair Recent;   Fair Remote;   Fair  Judgement:  Fair  Insight:  Fair  Psychomotor Activity:  Normal  Concentration:  Fair  Recall:  [employer]Fair  Fund of Knowledge:Fair  Language: Fair  Akathisia:  No  Handed:  Right  AIMS (if indicated):     Assets:  Communication Skills Desire for Improvement Physical Health  Sleep:  Number of Hours: 6.25    Musculoskeletal: Strength & Muscle Tone: within normal limits Gait & Station: normal Patient leans: N/A   Mental Status Per Nursing Assessment::   On Admission:     Current Mental Status by Physician: patient denies suicidal ideation, intent or plan  Loss Factors: Financial problems/change in  socioeconomic status  Historical Factors: NA  Risk Reduction Factors:   Impulsivity  Continued Clinical Symptoms:  Resolving delusions and psychosis  Cognitive Features That Contribute To Risk:  Closed-mindedness Polarized thinking    Suicide Risk:  Minimal: No identifiable suicidal ideation.  Patients presenting with no risk factors but with morbid ruminations; may be classified as minimal risk based on the severity of the depressive symptoms  Discharge Diagnoses:   AXIS I:  Schizoaffective disorder              Post traumatic stress disorder AXIS II:  Cluster B AXIS III:   Past Medical History  Diagnosis Date  . Hypertension    AXIS IV:  other psychosocial or environmental problems, problems related to social environment and problems with primary support group AXIS V:  61-70 mild symptoms  Plan Of Care/Follow-up recommendations:  Activity:  as tolerated Diet:  healthy Tests:  routine  Other:  patient to keep his after care appointment  Is patient on multiple antipsychotic therapies at discharge:  No   Has Patient had three or more failed trials of antipsychotic monotherapy by history:  No  Recommended Plan for Multiple Antipsychotic Therapies: NA    Thedore MinsMojeed Hadrian Yarbrough, MD 11/24/2013, 10:14 AM
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
## 2013-11-24 NOTE — [facility] Suicide Risk Assessment (Addendum)
Suicide Risk Assessment  Discharge Assessment     Demographic Factors:  Male and Caucasian  Total Time spent with patient: 20 minutes  Psychiatric Specialty Exam:     Blood pressure 117/83, pulse 73, temperature 98.2 F (36.8 C), temperature source Oral, resp. rate 16, height 5' 4.5" (1.638 m), weight 103.42 kg (228 lb), SpO2 96.00%.Body mass index is 38.55 kg/(m^2).  General Appearance: Fairly Groomed  Patent attorneyye Contact::  Fair  Speech:  Clear and Coherent  Volume:  fluctuates  Mood:  Anxious  Affect:  Appropriate  Thought Process:  Coherent and Goal Directed  Orientation:  Full (Time, Place, and Person)  Thought Content:  plans as he goes home, his committement not to act on thoughts or voices he might experience, and not hurt himself  Suicidal Thoughts:  No  Homicidal Thoughts:  No  Memory:  Immediate;   Fair Recent;   Fair Remote;   Fair  Judgement:  Fair  Insight:  Present  Psychomotor Activity:  Normal  Concentration:  Fair  Recall:  F[employer]Fair  Fund of Knowledge:NA  Language: Fair  Akathisia:  No  Handed:    AIMS (if indicated):     Assets:  Desire for Improvement Housing Social Support  Sleep:  Number of Hours: 6.25    Musculoskeletal: Strength & Muscle Tone: within normal limits Gait & Station: normal Patient leans: N/A   Mental Status Per Nursing Assessment::   On Admission:     Current Mental Status by Physician: In full contact with reality. More insightful. Committed not to hurt himself (had promised God if he did no die he will never try it again) Motivated to pursue outpatient follow up. Will be staying with father   Loss Factors: NA  Historical Factors: NA  Risk Reduction Factors:   Sense of responsibility to family, Religious beliefs about death, Living with another person, especially a relative and Positive social support  Continued Clinical Symptoms:  Schizophrenia:   Less than 26 years old  Cognitive Features That Contribute To Risk:   Closed-mindedness Polarized thinking Thought constriction (tunnel vision)    Suicide Risk:  Minimal: No identifiable suicidal ideation.  Patients presenting with no risk factors but with morbid ruminations; may be classified as minimal risk based on the severity of the depressive symptoms  Discharge Diagnoses:   AXIS I:  Schizoaffective Disorder, PTSD AXIS II:  No diagnosis AXIS III:   Past Medical History  Diagnosis Date  . Bipolar disorder   . Hypertension   . Depression    AXIS IV:  other psychosocial or environmental problems AXIS V:  61-70 mild symptoms  Plan Of Care/Follow-up recommendations:  Activity:  as tolerated Diet:  regular Follow up outpatient basis Is patient on multiple antipsychotic therapies at discharge:  No   Has Patient had three or more failed trials of antipsychotic monotherapy by history:  No  Recommended Plan for Multiple Antipsychotic Therapies: NA    Rachael FeeIrving A Janifer Gieselman 11/24/2013, 2:46 PM

## 2013-11-24 NOTE — Progress Notes (Signed)
D/C instructions/meds/follow-up appointments reviewed, pt verbalized understanding, pt's belongings returned to pt, samples given, denies SI/HI/AVH 
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
## 2013-11-24 NOTE — Progress Notes (Signed)
D:  Per pt self inventory pt reports sleeping well, appetite good, energy level high, ability to pay attention good, rates depression at a 0 out of 10 and hopelessness at a 0 out of 10, no complaints at this time, denies SI/HI/AVH.     A:  Emotional support provided, Encouraged pt to continue with treatment plan and attend all group activities, q15 min checks maintained for safety.  R:  Pt is going to groups, ready to go home today, cooperative with staff and other patients.

## 2013-11-25 ENCOUNTER — Encounter: Payer: Self-pay | Admitting: Family Medicine

## 2013-11-29 NOTE — Progress Notes (Signed)
Patient Discharge Instructions:  After Visit Summary (AVS):   Faxed to:  11/29/13 Discharge Summary Note:   Faxed to:  11/29/13 Psychiatric Admission Assessment Note:   Faxed to:  11/29/13 Suicide Risk Assessment - Discharge Assessment:   Faxed to:  11/29/13 Faxed/Sent to the Next Level Care provider:  11/29/13 [facility]ax[facility] to [facility] @ [phone]  Jerelene ReddenSheena E [place], 11/29/2013, 3:42 PM

## 2013-12-06 DIAGNOSIS — F319 Bipolar disorder, unspecified: Secondary | ICD-10-CM | POA: Insufficient documentation

## 2013-12-06 DIAGNOSIS — S1190XA Unspecified open wound of unspecified part of neck, initial encounter: Secondary | ICD-10-CM | POA: Insufficient documentation

## 2013-12-06 DIAGNOSIS — X789XXA Intentional self-harm by unspecified sharp object, initial encounter: Secondary | ICD-10-CM | POA: Insufficient documentation

## 2013-12-06 DIAGNOSIS — R45851 Suicidal ideations: Secondary | ICD-10-CM | POA: Insufficient documentation

## 2013-12-06 DIAGNOSIS — F840 Autistic disorder: Secondary | ICD-10-CM | POA: Insufficient documentation

## 2013-12-06 DIAGNOSIS — F411 Generalized anxiety disorder: Secondary | ICD-10-CM | POA: Insufficient documentation

## 2013-12-06 DIAGNOSIS — Z79899 Other long term (current) drug therapy: Secondary | ICD-10-CM | POA: Insufficient documentation

## 2013-12-06 DIAGNOSIS — IMO0002 Reserved for concepts with insufficient information to code with codable children: Secondary | ICD-10-CM | POA: Insufficient documentation

## 2013-12-06 DIAGNOSIS — I10 Essential (primary) hypertension: Secondary | ICD-10-CM | POA: Insufficient documentation

## 2013-12-07 ENCOUNTER — Emergency Department (HOSPITAL_COMMUNITY)
Admission: EM | Admit: 2013-12-07 | Discharge: 2013-12-07 | Disposition: A | Discharge status: Other Acute Inpatient Hospital | Payer: MEDICAID | Attending: Emergency Medicine | Admitting: Emergency Medicine

## 2013-12-07 ENCOUNTER — Encounter (HOSPITAL_COMMUNITY): Payer: Self-pay | Admitting: Emergency Medicine

## 2013-12-07 DIAGNOSIS — R4589 Other symptoms and signs involving emotional state: Secondary | ICD-10-CM

## 2013-12-07 DIAGNOSIS — R4689 Other symptoms and signs involving appearance and behavior: Secondary | ICD-10-CM

## 2013-12-07 LAB — ETHANOL: Alcohol, Ethyl (B): <11 mg/dL (ref 0–11)

## 2013-12-07 LAB — COMPREHENSIVE METABOLIC PANEL
ALBUMIN: 3.6 g/dL (ref 3.5–5.2)
ALT: 26 U/L (ref 0–53)
AST: 18 U/L (ref 0–37)
Alkaline Phosphatase: 66 U/L (ref 39–117)
BUN: 16 mg/dL (ref 6–23)
CALCIUM: 9.4 mg/dL (ref 8.4–10.5)
CO2: 31 mEq/L (ref 19–32)
Chloride: 100 mEq/L (ref 96–112)
Creatinine, Ser: 0.93 mg/dL (ref 0.50–1.35)
GFR calc Af Amer: >90 mL/min (ref >90)
GFR calc non Af Amer: >90 mL/min (ref >90)
Glucose, Bld: 149 mg/dL — ABNORMAL HIGH (ref 70–99)
Potassium: 4.1 mEq/L (ref 3.7–5.3)
SODIUM: 140 meq/L (ref 137–147)
TOTAL PROTEIN: 6.7 g/dL (ref 6.0–8.3)
Total Bilirubin: 0.2 mg/dL — ABNORMAL LOW (ref 0.3–1.2)

## 2013-12-07 LAB — CBC WITH DIFFERENTIAL/PLATELET
Basophils Absolute: 0 10*3/uL (ref 0.0–0.1)
Basophils Relative: 0 % (ref 0–1)
Eosinophils Absolute: 0.2 10*3/uL (ref 0.0–0.7)
Eosinophils Relative: 2 % (ref 0–5)
HCT: 40.1 % (ref 39.0–52.0)
Hemoglobin: 13.6 g/dL (ref 13.0–17.0)
Lymphocytes Relative: 39 % (ref 12–46)
Lymphs Abs: 2.4 10*3/uL (ref 0.7–4.0)
MCH: 29.1 pg (ref 26.0–34.0)
MCHC: 33.9 g/dL (ref 30.0–36.0)
MCV: 85.9 fL (ref 78.0–100.0)
Monocytes Absolute: 0.4 10*3/uL (ref 0.1–1.0)
Monocytes Relative: 7 % (ref 3–12)
Neutro Abs: 3.2 10*3/uL (ref 1.7–7.7)
Neutrophils Relative %: 52 % (ref 43–77)
Platelets: 165 10*3/uL (ref 150–400)
RBC: 4.67 MIL/uL (ref 4.22–5.81)
RDW: 12.9 % (ref 11.5–15.5)
WBC: 6.2 10*3/uL (ref 4.0–10.5)

## 2013-12-07 LAB — RAPID URINE DRUG SCREEN, HOSP PERFORMED
Amphetamines: NOT DETECTED
BENZODIAZEPINES: NOT DETECTED
Barbiturates: NOT DETECTED
COCAINE: NOT DETECTED
Opiates: NOT DETECTED
TETRAHYDROCANNABINOL: NOT DETECTED

## 2013-12-07 MED ORDER — TRAZODONE HCL 50 MG PO TABS: 50.0000 mg | ORAL_TABLET | Freq: Every day | ORAL | Status: DC

## 2013-12-07 MED ORDER — LURASIDONE HCL 40 MG PO TABS
120.0000 mg | ORAL_TABLET | Freq: Every day | ORAL | Status: DC
  Administered 2013-12-07: 120 mg via ORAL
  Filled 2013-12-07 (×2): qty 3

## 2013-12-07 MED ORDER — PANTOPRAZOLE SODIUM 40 MG PO TBEC
DELAYED_RELEASE_TABLET | ORAL | Status: AC
  Administered 2013-12-07: 40 mg via ORAL
  Filled 2013-12-07: qty 1

## 2013-12-07 MED ORDER — FAMOTIDINE 20 MG PO TABS
ORAL_TABLET | ORAL | Status: AC
  Administered 2013-12-07: 20 mg via ORAL
  Filled 2013-12-07: qty 1

## 2013-12-07 MED ORDER — PAROXETINE HCL 20 MG PO TABS
40.0000 mg | ORAL_TABLET | Freq: Every day | ORAL | Status: DC
  Administered 2013-12-07: 40 mg via ORAL
  Filled 2013-12-07 (×2): qty 2

## 2013-12-07 MED ORDER — LURASIDONE HCL 80 MG PO TABS
120.0000 mg | ORAL_TABLET | Freq: Every day | ORAL | Status: DC
  Filled 2013-12-07: qty 1

## 2013-12-07 MED ORDER — LORATADINE 10 MG PO TABS
10.0000 mg | ORAL_TABLET | Freq: Every day | ORAL | Status: DC
  Administered 2013-12-07: 10 mg via ORAL

## 2013-12-07 MED ORDER — LORATADINE 10 MG PO TABS
ORAL_TABLET | ORAL | Status: AC
  Administered 2013-12-07: 10 mg via ORAL
  Filled 2013-12-07: qty 1

## 2013-12-07 MED ORDER — QUETIAPINE FUMARATE 200 MG PO TABS
400.0000 mg | ORAL_TABLET | Freq: Every day | ORAL | Status: DC
  Filled 2013-12-07: qty 1

## 2013-12-07 MED ORDER — METOPROLOL TARTRATE 25 MG PO TABS
25.0000 mg | ORAL_TABLET | Freq: Two times a day (BID) | ORAL | Status: DC
  Administered 2013-12-07: 25 mg via ORAL
  Filled 2013-12-07: qty 1

## 2013-12-07 MED ORDER — AMLODIPINE BESYLATE 5 MG PO TABS
5.0000 mg | ORAL_TABLET | Freq: Every day | ORAL | Status: DC
  Administered 2013-12-07: 5 mg via ORAL

## 2013-12-07 MED ORDER — PANTOPRAZOLE SODIUM 40 MG PO TBEC
40.0000 mg | DELAYED_RELEASE_TABLET | Freq: Every day | ORAL | Status: DC
  Administered 2013-12-07: 40 mg via ORAL

## 2013-12-07 MED ORDER — AMLODIPINE BESYLATE 5 MG PO TABS
ORAL_TABLET | ORAL | Status: AC
Start: 2013-12-07 — End: 2013-12-07
  Administered 2013-12-07: 5 mg via ORAL
  Filled 2013-12-07: qty 1

## 2013-12-07 MED ORDER — FAMOTIDINE 20 MG PO TABS
20.0000 mg | ORAL_TABLET | Freq: Two times a day (BID) | ORAL | Status: DC
Start: 2013-12-07 — End: 2013-12-07
  Administered 2013-12-07: 20 mg via ORAL
  Filled 2013-12-07: qty 1

## 2013-12-07 MED ORDER — PANTOPRAZOLE SODIUM 40 MG PO TBEC: 40.0000 mg | DELAYED_RELEASE_TABLET | Freq: Every day | ORAL | Status: DC

## 2013-12-07 MED ORDER — TRAZODONE HCL 50 MG PO TABS: 50.0000 mg | ORAL_TABLET | Freq: Every evening | ORAL | Status: DC | PRN

## 2013-12-07 MED ORDER — FLUTICASONE PROPIONATE 50 MCG/ACT NA SUSP
2.0000 | Freq: Every day | NASAL | Status: DC
  Administered 2013-12-07: 2 via NASAL
  Filled 2013-12-07: qty 16
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
## 2013-12-07 NOTE — ED Notes (Signed)
[facility] and PT has been except[facility] and [facility]. Dr. Ananias PilgrimShalla is the accepting MD at facility. Number to give report at [facility] [phone].

## 2013-12-07 NOTE — ED Provider Notes (Signed)
[facility] by Dr. Ananias PilgrimShalla.  Patient sleeping, he was awakened. He's noted to have a fresh mark around his neck. Patient indicates he is still feeling like he wants to hurt himself. First opinion papers were signed.  Devoria AlbeIva Claretta Kendra, MD, FACEP   Ward GivensIva L Celestino Ackerman, MD 12/07/13 [phone]

## 2013-12-07 NOTE — ED Provider Notes (Signed)
CSN: [id number]     Arrival date & time 12/06/13  2339 History   First MD Initiated Contact with Patient 12/07/13 0026 This chart was scribed for Benny LennertJoseph L Donte Kary, MD by Valera CastleSteven Perry, ED Scribe. This patient was seen in room [facility] and the patient's care was started at 12:32 AM.     Chief Complaint  Patient presents with  . V70.1   (Consider location/radiation/quality/duration/timing/severity/associated sxs/prior Treatment) The history is provided by the patient. No language interpreter was used.   HPI Comments: Peter Elliott is a 28 y.o. male who presents to the Emergency Department for v70.1. Pt complains of an SI attempt by cutting his throat. He has healing lacerations around his neck. He states he has h/o depression and anger, states his symptoms worsened today. He reports wanting help. He reports h/o  disease.    Past Medical History  Diagnosis Date  . PTSD (post-traumatic stress disorder)   . Anxiety   . Panic attack   . Dissociative identity disorder   . Asperger syndrome   . Autism   . GERD (gastroesophageal reflux disease)   . Schizophrenia, schizo-affective   . Bipolar disorder   . Hypertension   . Depression    Past Surgical History  Procedure Laterality Date  . Wound exploration N/A 11/03/2013    Procedure: NECK EXPLORATION;  Surgeon: Adolph Pollackodd J Rosenbower, MD;  [facility];  Service: General;  Laterality: N/A;   Family History  Problem Relation Age of Onset  . Diabetes Other   . Schizophrenia Other   . Psychosis Other   . Bipolar disorder Other    History  Substance Use Topics  . Smoking status: Never Smoker   . Smokeless tobacco: Not on file  . Alcohol Use: No    Review of Systems  Skin: Positive for wound.       + healed lacerations over throat  Psychiatric/Behavioral: Positive for suicidal ideas, dysphoric mood and agitation. Negative for hallucinations.   Allergies  Review of patient's allergies indicates no known allergies.  Home  Medications   Prior to Admission medications   Medication Sig Start Date End Date Taking? Authorizing Provider  amLODipine (NORVASC) 5 MG tablet Take 1 tablet (5 mg total) by mouth daily. 12/15/12   Ileana LaddFrancis P Wong, MD  cetirizine (ZYRTEC) 10 MG tablet Take 1 tablet (10 mg total) by mouth daily. 05/13/13   Deatra CanterWilliam J Oxford, FNP  fluticasone (FLONASE) 50 MCG/ACT nasal spray Place 2 sprays into the nose daily. 05/13/13   Deatra CanterWilliam J Oxford, FNP  lurasidone 120 MG TABS Take 1 tablet (120 mg total) by mouth daily at 6 PM. 11/24/13   Fransisca KaufmannLaura Davis, NP  Lurasidone HCl 120 MG TABS Take 1 tablet by mouth daily.    Historical Provider, MD  metoprolol tartrate (LOPRESSOR) 25 MG tablet Take 1 tablet (25 mg total) by mouth 2 (two) times daily. 11/24/13   Fransisca KaufmannLaura Davis, NP  omeprazole (PRILOSEC) 20 MG capsule Take 1 capsule (20 mg total) by mouth daily. 05/13/13   Deatra CanterWilliam J Oxford, FNP  pantoprazole (PROTONIX) 40 MG tablet Take 1 tablet (40 mg total) by mouth daily. 11/24/13   Fransisca KaufmannLaura Davis, NP  PARoxetine (PAXIL) 40 MG tablet Take 40 mg by mouth every morning.    Historical Provider, MD  QUEtiapine (SEROQUEL) 200 MG tablet Take 400 mg by mouth at bedtime.     Historical Provider, MD  ranitidine (ZANTAC) 150 MG tablet Take 1 tablet (150 mg total) by mouth daily. 05/13/13  Deatra CanterWilliam J Oxford, FNP  traZODone (DESYREL) 50 MG tablet Take 50 mg by mouth at bedtime.    Historical Provider, MD  traZODone (DESYREL) 50 MG tablet Take 1 tablet (50 mg total) by mouth at bedtime as needed for sleep. 11/24/13   Fransisca KaufmannLaura Davis, NP   BP 119/71  Pulse 79  Temp(Src) 97.6 F (36.4 C) (Oral)  Resp 18  SpO2 99%  Physical Exam  Nursing note and vitals reviewed. Constitutional: He is oriented to person, place, and time. He appears well-developed and well-nourished. No distress.  HENT:  Head: Normocephalic and atraumatic.  Eyes: EOM are normal.  Neck: Neck supple. Tracheal deviation present.  Healed lacerations to his neck.   Cardiovascular: Normal rate.   Pulmonary/Chest: Effort normal. No respiratory distress.  Musculoskeletal: Normal range of motion.  Neurological: He is alert and oriented to person, place, and time.  Skin: Skin is warm and dry.  Psychiatric: He has a normal mood and affect. His behavior is normal. He expresses suicidal ideation.   ED Course  Procedures (including critical care time)  DIAGNOSTIC STUDIES: Oxygen Saturation is 99% on room air, normal by my interpretation.    COORDINATION OF CARE: 12:35 AM-Discussed treatment plan which includes blood work, drug screen, and consult to TTS with pt at bedside and pt agreed to plan.   Labs Review Labs Reviewed - No data to display  Imaging Review No results found.   EKG Interpretation None     Medications - No data to display MDM   Final diagnoses:  None   The chart was scribed for me under my direct supervision.  I personally performed the history, physical, and medical decision making and all procedures in the evaluation of this patient.Benny Lennert.   Elizette Shek L Nakayla Rorabaugh, MD 12/07/13 [phone]

## 2013-12-07 NOTE — ED Notes (Signed)
Pt c/o si attempt by cutting his throat. Pt has superficial cuts to the right side of neck. Pt states he has been having episode of depression and anger. Pt state the si ideations got bad today. Pt has ivc paperwork.

## 2013-12-07 NOTE — ED Notes (Signed)
Sarah [facility] at bedside doing eval at this time.

## 2013-12-07 NOTE — BH Assessment (Signed)
[facility] to Dr. Guss Bundehalla, per Lupita Leashonna. Call report to [phone] before pt is transported. RN notified.  -Dossie ArbourAnthony Jacobb Alen, MA  Disposition [facility]

## 2013-12-07 NOTE — ED Notes (Signed)
Pt states he has been having thoughts about things that happened in the past. Pt states wanting to harm himself. Pt was hospitalized last month & released about 2 weeks ago. 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

## 2013-12-07 NOTE — [facility] Counselor (Signed)
This Clinical research associatewriter spoke with Alberteen SamFran Hobson, NP, she recommends inpt admission.  NP recommends an appropriate facility for Autism/Asperger's.  TTS will seek other placement.

## 2013-12-07 NOTE — ED Notes (Signed)
TTS completed. 

## 2013-12-07 NOTE — BH Assessment (Signed)
Referrals faxed to [facility], [facility], and [place] for [place].  Peter Arbour-Sid Greener, MA  Disposition [facility]

## 2013-12-07 NOTE — ED Notes (Signed)
Patient awake, speaking with sitter.

## 2013-12-07 NOTE — ED Notes (Signed)
[place] PD allowed to leave.

## 2013-12-25 ENCOUNTER — Encounter (HOSPITAL_COMMUNITY): Payer: Self-pay | Admitting: Emergency Medicine

## 2013-12-25 ENCOUNTER — Emergency Department (HOSPITAL_COMMUNITY)
Admission: EM | Admit: 2013-12-25 | Discharge: 2013-12-29 | Disposition: A | Discharge status: Home / Self Care | Payer: MEDICAID | Attending: Emergency Medicine | Admitting: Emergency Medicine

## 2013-12-25 DIAGNOSIS — F848 Other pervasive developmental disorders: Secondary | ICD-10-CM | POA: Diagnosis not present

## 2013-12-25 DIAGNOSIS — I10 Essential (primary) hypertension: Secondary | ICD-10-CM | POA: Diagnosis not present

## 2013-12-25 DIAGNOSIS — K219 Gastro-esophageal reflux disease without esophagitis: Secondary | ICD-10-CM | POA: Insufficient documentation

## 2013-12-25 DIAGNOSIS — F4310 Post-traumatic stress disorder, unspecified: Secondary | ICD-10-CM | POA: Insufficient documentation

## 2013-12-25 DIAGNOSIS — F319 Bipolar disorder, unspecified: Secondary | ICD-10-CM | POA: Diagnosis not present

## 2013-12-25 DIAGNOSIS — F4481 Dissociative identity disorder: Secondary | ICD-10-CM | POA: Diagnosis not present

## 2013-12-25 DIAGNOSIS — F329 Major depressive disorder, single episode, unspecified: Secondary | ICD-10-CM | POA: Insufficient documentation

## 2013-12-25 DIAGNOSIS — F410 Panic disorder [episodic paroxysmal anxiety] without agoraphobia: Secondary | ICD-10-CM | POA: Insufficient documentation

## 2013-12-25 DIAGNOSIS — R45851 Suicidal ideations: Secondary | ICD-10-CM

## 2013-12-25 DIAGNOSIS — F3289 Other specified depressive episodes: Secondary | ICD-10-CM | POA: Insufficient documentation

## 2013-12-25 DIAGNOSIS — F411 Generalized anxiety disorder: Secondary | ICD-10-CM | POA: Insufficient documentation

## 2013-12-25 DIAGNOSIS — F259 Schizoaffective disorder, unspecified: Secondary | ICD-10-CM | POA: Diagnosis not present

## 2013-12-25 DIAGNOSIS — Z046 Encounter for general psychiatric examination, requested by authority: Secondary | ICD-10-CM | POA: Diagnosis present

## 2013-12-25 LAB — BASIC METABOLIC PANEL
BUN: 17 mg/dL (ref 6–23)
CHLORIDE: 102 meq/L (ref 96–112)
CO2: 27 mEq/L (ref 19–32)
CREATININE: 0.96 mg/dL (ref 0.50–1.35)
Calcium: 9.8 mg/dL (ref 8.4–10.5)
GFR calc non Af Amer: >90 mL/min (ref >90)
Glucose, Bld: 126 mg/dL — ABNORMAL HIGH (ref 70–99)
Potassium: 4.2 mEq/L (ref 3.7–5.3)
Sodium: 141 mEq/L (ref 137–147)

## 2013-12-25 LAB — CBC WITH DIFFERENTIAL/PLATELET
BASOS ABS: 0 10*3/uL (ref 0.0–0.1)
BASOS PCT: 0 % (ref 0–1)
EOS ABS: 0.1 10*3/uL (ref 0.0–0.7)
Eosinophils Relative: 2 % (ref 0–5)
HCT: 42 % (ref 39.0–52.0)
Hemoglobin: 14.2 g/dL (ref 13.0–17.0)
Lymphocytes Relative: 38 % (ref 12–46)
Lymphs Abs: 2.6 10*3/uL (ref 0.7–4.0)
MCH: 28.6 pg (ref 26.0–34.0)
MCHC: 33.8 g/dL (ref 30.0–36.0)
MCV: 84.5 fL (ref 78.0–100.0)
MONO ABS: 0.5 10*3/uL (ref 0.1–1.0)
Monocytes Relative: 7 % (ref 3–12)
NEUTROS ABS: 3.7 10*3/uL (ref 1.7–7.7)
NEUTROS PCT: 53 % (ref 43–77)
Platelets: 176 10*3/uL (ref 150–400)
RBC: 4.97 MIL/uL (ref 4.22–5.81)
RDW: 12.9 % (ref 11.5–15.5)
WBC: 6.9 10*3/uL (ref 4.0–10.5)

## 2013-12-25 LAB — RAPID URINE DRUG SCREEN, HOSP PERFORMED
Amphetamines: NOT DETECTED
BENZODIAZEPINES: NOT DETECTED
Barbiturates: NOT DETECTED
Cocaine: NOT DETECTED
Opiates: NOT DETECTED
Tetrahydrocannabinol: NOT DETECTED

## 2013-12-25 LAB — URINALYSIS, ROUTINE W REFLEX MICROSCOPIC
BILIRUBIN URINE: NEGATIVE
Glucose, UA: NEGATIVE mg/dL
Hgb urine dipstick: NEGATIVE
KETONES UR: NEGATIVE mg/dL
LEUKOCYTES UA: NEGATIVE
Nitrite: NEGATIVE
PH: 6.5 (ref 5.0–8.0)
Protein, ur: NEGATIVE mg/dL
Specific Gravity, Urine: 1.025 (ref 1.005–1.030)
Urobilinogen, UA: 1 mg/dL (ref 0.0–1.0)

## 2013-12-25 LAB — ETHANOL
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
## 2013-12-25 NOTE — ED Notes (Signed)
Spoke with patient's father who reports pt was seen by a new psychiatrist on Friday who reportedly took him off of his [name] that has been apparently doing well for him and he had been feeling better.  It is unknown why the psychiatrist took him off of it other than he told the patient that it might have made him gain weight.  Per pt's father, pt is at his normal baseline other than he reports he is more depressed over this weekend.

## 2013-12-25 NOTE — ED Notes (Signed)
Patient claims that he can kill people super-naturally.

## 2013-12-26 MED ORDER — ZOLPIDEM TARTRATE 5 MG PO TABS
5.0000 mg | ORAL_TABLET | Freq: Every evening | ORAL | Status: DC | PRN
  Administered 2013-12-27: 5 mg via ORAL
  Filled 2013-12-26 (×2): qty 1

## 2013-12-26 MED ORDER — NICOTINE 21 MG/24HR TD PT24
21.0000 mg | MEDICATED_PATCH | Freq: Every day | TRANSDERMAL | Status: DC
  Filled 2013-12-26: qty 1

## 2013-12-26 MED ORDER — ALUM & MAG HYDROXIDE-SIMETH 200-200-20 MG/5ML PO SUSP: 30.0000 mL | ORAL | Status: DC | PRN

## 2013-12-26 MED ORDER — ONDANSETRON HCL 4 MG PO TABS
4.0000 mg | ORAL_TABLET | Freq: Three times a day (TID) | ORAL | Status: DC | PRN
Start: 2013-12-26 — End: 2013-12-29

## 2013-12-26 MED ORDER — LORAZEPAM 1 MG PO TABS
1.0000 mg | ORAL_TABLET | Freq: Three times a day (TID) | ORAL | Status: DC | PRN
  Administered 2013-12-27 – 2013-12-28 (×2): 1 mg via ORAL
  Filled 2013-12-26 (×2): qty 1

## 2013-12-26 MED ORDER — ACETAMINOPHEN 325 MG PO TABS: 650.0000 mg | ORAL_TABLET | ORAL | Status: DC | PRN

## 2013-12-26 MED ORDER — IBUPROFEN 200 MG PO TABS: 600.0000 mg | ORAL_TABLET | Freq: Three times a day (TID) | ORAL | Status: DC | PRN

## 2013-12-26 NOTE — Progress Notes (Signed)
MHT completed a consult with Silvio Pate, from [facility], who concurs with Alberteen Sam, NP for inpatient treatment.  Pt has been added to [facility] wait list to be review for acceptance on 6/3.  MHT contacted pt [facility] care coordinator Jenel Lucks, [phone].  Blain Pais, MHT/NS
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
## 2013-12-26 NOTE — ED Notes (Signed)
Telepsych being performed. 

## 2013-12-26 NOTE — ED Notes (Signed)
Pt taking shower, security present.

## 2013-12-26 NOTE — ED Notes (Signed)
Fax received from Jenel Lucks at [employer]. Ms Peter Elliott is requesting the patient sign a consent for release of information. Per [employer] Work will have to handle the paperwork with the pt's father. Father is the parent's guardian, pt incompetent to sign form. Social Work to Diplomatic Services operational officer.

## 2013-12-26 NOTE — ED Notes (Signed)
Walked pt in hallway, pt is calm, back in room drinking diet coke, sitter in hallway

## 2013-12-26 NOTE — BH Assessment (Signed)
LOCUS 1. 5 2. 4 3.3 4a . 3 4b. 3 5. 4 6. 3

## 2013-12-26 NOTE — ED Notes (Signed)
Patient went to bathroom. Vitals taken at this time. Patient states that he is feeling homicidal at this time. States that he is gay and wants a steady boyfriend and wants to get married. States that he prays to Islam gods.

## 2013-12-26 NOTE — Progress Notes (Signed)
MHT spoke with [facility] clinician, Mark who report clt has cancelled multiple [facility] outpatients in [place] and [place] counties.  MHT spoke with [facility], who reported discharging him with outpatient appointments and the following prescriptions Latuda 120 mg daily, Trazadone 50 mg HS and Prozac 40mg  daily.  Note, clt and [facility] have reported multiple hospitalizations and [facility] recommends referring to [facility] ACTT services upon discharge after inpatient treatment.  Blain Pais, MHT/NS

## 2013-12-26 NOTE — ED Notes (Signed)
Pt pacing in hallway. Talking to sitter. Pt reporting speaking in [place] to a bird, applying to [employer].

## 2013-12-26 NOTE — ED Notes (Signed)
Pt states he does not smoke and does not want the nicotine patch.

## 2013-12-26 NOTE — ED Notes (Signed)
Pt sleeping in room

## 2013-12-26 NOTE — Progress Notes (Addendum)
MHT contacted the following facilities with bed availability for I/DD programming:  1)[facility]-faxed referral 2)[facility]-at capacity until Tuesday 3)[facility], RN asked to fax full packet when have IQ scores only 4)[facility]-spoke with Jesusita Oka about I/DD; bed control will callback  Blain Pais, MHT/NS

## 2013-12-26 NOTE — ED Notes (Signed)
[facility] phone and patient has been accepted but there is a need for I Q score. Informed her if its not in epic then we have no knowledge of the score. She stated that Lytle Butte, care coordinator may know it or father may know the score. Will pass on to next shift

## 2013-12-26 NOTE — Progress Notes (Signed)
The MHT referred the patient to the following facilities    1)Per [facility] @ [facility] the patient is being reviewed.  2)[facility]-at capacity until Tuesday  3 )[facility], RN asked to fax full packet when have IQ scores only.  At present the writer is attempting to obtain      the IQ scores.  4) Per Melody @ [facility] the hospital is at capacity with no expected discharges.     The patients phone number listed in epic [phone] is not a working number.  Writer was able to contact the patients father at [phone].  His father reports that he does not have his most recent Psychological.  His was discharged from [facility] 10 days ago.  Writer left a voice mail message with their Medical Records unit regarding the patients IQ scores.  Writer left a message with the patients Care Coordinator at [facility] regarding placement and obtaining a copy of his IQ scores.
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
## 2013-12-26 NOTE — ED Notes (Signed)
Pt awaiting security to take a shower. Walking in hallway outside of room.

## 2013-12-27 MED ORDER — LURASIDONE HCL 40 MG PO TABS
120.0000 mg | ORAL_TABLET | Freq: Every day | ORAL | Status: DC
  Administered 2013-12-27 – 2013-12-29 (×3): 120 mg via ORAL
  Filled 2013-12-27 (×7): qty 3

## 2013-12-27 MED ORDER — FLUOXETINE HCL 20 MG PO CAPS
40.0000 mg | ORAL_CAPSULE | Freq: Every morning | ORAL | Status: DC
  Administered 2013-12-27 – 2013-12-29 (×3): 40 mg via ORAL
  Filled 2013-12-27 (×3): qty 2

## 2013-12-27 MED ORDER — OLANZAPINE 5 MG PO TBDP: 10.0000 mg | ORAL_TABLET | Freq: Every day | ORAL | Status: DC

## 2013-12-27 MED ORDER — TRAZODONE HCL 50 MG PO TABS
50.0000 mg | ORAL_TABLET | Freq: Every day | ORAL | Status: DC
  Administered 2013-12-27 – 2013-12-28 (×2): 50 mg via ORAL
  Filled 2013-12-27 (×3): qty 1

## 2013-12-27 MED ORDER — OLANZAPINE 5 MG PO TBDP
ORAL_TABLET | ORAL | Status: AC
  Administered 2013-12-27: 10 mg via ORAL
  Filled 2013-12-27: qty 2

## 2013-12-27 MED ORDER — OLANZAPINE 5 MG PO TABS
10.0000 mg | ORAL_TABLET | Freq: Every day | ORAL | Status: DC | PRN
Start: 2013-12-27 — End: 2013-12-27

## 2013-12-27 MED ORDER — OLANZAPINE 10 MG PO TBDP
10.0000 mg | ORAL_TABLET | Freq: Every day | ORAL | Status: DC | PRN
  Administered 2013-12-27 – 2013-12-28 (×2): 10 mg via ORAL
  Filled 2013-12-27: qty 2

## 2013-12-27 MED ORDER — METOPROLOL TARTRATE 25 MG PO TABS: 25.0000 mg | ORAL_TABLET | Freq: Two times a day (BID) | ORAL | Status: DC

## 2013-12-27 NOTE — ED Notes (Signed)
Patient was on the phone with family and stated to them that he was going to lie on his revaluation and state that he was no longer feeling Si/Hi. Family is very concerned about patients safety.

## 2013-12-27 NOTE — ED Notes (Signed)
Spoke with Ava at behavioral health who advised they will perform an additional tele psych this afternoon, [facility] was here with pt and advised that pt's behavior is his baseline,

## 2013-12-27 NOTE — ED Notes (Signed)
Pt states that he is agitated due to being in the room all day and night, pt pacing around in room, ativan given per prn order, update given on plan of care, sitter remains at bedside,

## 2013-12-27 NOTE — ED Notes (Signed)
Pt beginning to pace in his room, states " i don't know anything about a guardian and if I have a guardian I will kill them", prn medication given as ordered,

## 2013-12-27 NOTE — BHH Counselor (Signed)
TC from Mindy RN re: TP for pt. Writer informed Mindy that there were other telepsychs scheduled so it is possible TP wouldn't be conducted until [facility] night extender arrived. Mindy's # [phone]. TTS will keep her informed re scheduling TP.  Evette Cristal, [place] Assessment Counselor

## 2013-12-27 NOTE — ED Notes (Signed)
Pt. Requesting ambien.
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
## 2013-12-27 NOTE — ED Provider Notes (Signed)
meds updated Labs reviewed Awaiting placement No acute issue at this time BP 123/72  Pulse 63  Temp(Src) 98 F (36.7 C) (Oral)  Resp 16  Ht 5\' 7"  (1.702 m)  Wt 249 lb (112.946 kg)  BMI 38.99 kg/m2  SpO2 97%   Joya Gaskins, MD 12/27/13 0110

## 2013-12-27 NOTE — ED Notes (Signed)
Pt still waiting for consult from [facility] and social worker,

## 2013-12-27 NOTE — ED Notes (Signed)
Attempted to call BH assessment to find out when re-assessment would be but no one answered the phone.

## 2013-12-27 NOTE — ED Notes (Signed)
[facility] called, states that they will come up here sometime today and get the pt to sign documentation that she needs.

## 2013-12-27 NOTE — ED Notes (Signed)
[facility] staff at bedside speaking with pt,

## 2013-12-27 NOTE — ED Notes (Signed)
Pt. Given drink 

## 2013-12-27 NOTE — ED Notes (Signed)
Pt. Requesting something to help him sleep. Explained to pt. He has orders to receive ambien to help him sleep. Pt. Refused ambien.

## 2013-12-27 NOTE — ED Notes (Signed)
[facility] called for updates, I told there reassessment has not been completed at this time and pt has been asleep since I took over at 1500. States they will call back for updates in the morning

## 2013-12-27 NOTE — ED Notes (Addendum)
Pt ambulatory to restroom, tolerated well, sitter remains at bedside,  

## 2013-12-27 NOTE — ED Notes (Signed)
Pt resting with eyes closed, will arouse when spoken to, pt updated on plan of care, sitter remains at bedside,

## 2013-12-27 NOTE — ED Notes (Signed)
Pt resting with eyes closed, will arouse when spoken to , sitter remains at bedside,

## 2013-12-27 NOTE — ED Notes (Signed)
Pt states he is his own guardian. States he takes care of all of his affairs with the help of his dad and sister. 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 States he is solving all of their political issues. Also, states he is able to hack into their records and was told by them they will hire him on the spot when he gets his bachelors.

## 2013-12-28 DIAGNOSIS — R45850 Homicidal ideations: Secondary | ICD-10-CM

## 2013-12-28 DIAGNOSIS — F319 Bipolar disorder, unspecified: Secondary | ICD-10-CM

## 2013-12-28 DIAGNOSIS — R45851 Suicidal ideations: Secondary | ICD-10-CM

## 2013-12-28 DIAGNOSIS — F4310 Post-traumatic stress disorder, unspecified: Secondary | ICD-10-CM

## 2013-12-28 NOTE — ED Notes (Signed)
Pt pacing in hallway talking animatedly about a war between private and government insurance.

## 2013-12-28 NOTE — ED Notes (Signed)
RN spoke with Peter Elliott  from [employer]. RN had pt sign release of medical info and faxed to Sarah at [employer].

## 2013-12-28 NOTE — ED Provider Notes (Signed)
psyc wanted pt to go to [facility] for [facility].   Dr. Arizona Constable notified  Benny Lennert, MD 12/28/13 [phone]

## 2013-12-28 NOTE — ED Notes (Signed)
Patient sleeping at this time.

## 2013-12-28 NOTE — Progress Notes (Signed)
Called to speak with Care Coordinator Jenel Lucks [phone]. Left a voicemail that the NP Renata Caprice has recommended IP hospitalization. Writer will continue to refer pt to another facility.

## 2013-12-28 NOTE — ED Notes (Signed)
Patient ambulatory to bathroom.  Sitter remains with patient.

## 2013-12-28 NOTE — ED Notes (Signed)
Patient remains asleep.  Will continue to monitor.

## 2013-12-28 NOTE — ED Notes (Signed)
TSS in progress.  

## 2013-12-28 NOTE — ED Notes (Signed)
Patient arrived via Patent examiner. Patient was angry. He said he had a bag of medicine with him when he came from [facility] that did not get transferred over with him. I spoke with Ginger at [facility] and she said that they had his medication locked up in the pharmacy and that he needed to call family to go to [facility] and pick it up for him because the [facility] did not have the keys to get it out for him after hours. Patient given the telephone to use and he did call his father and asked him to pick up his medication for him tomorrow. Patient admits to being suicidal but contracts for safety. Denies any intent to harm anyone else. Denies hearing voices. Denies being in any pain at this time. Meal and fluids given. Billy Coast, RN

## 2013-12-28 NOTE — ED Notes (Signed)
Patient remains asleep at this time.

## 2013-12-28 NOTE — ED Notes (Signed)
Per AVA at [facility]- pt should be seen by [facility] to meet criteria for inpatient placement at another facility. Ava to f/u with  [facility].

## 2013-12-28 NOTE — ED Notes (Signed)
Pt pacing in hallway, telling sitter he can see the past, present, and future.  1mg  ativan given po.  Pt awaiting telepsyche assessment.
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
## 2013-12-28 NOTE — Progress Notes (Signed)
The [facility] referred the patient to the following facilities  1)[facility] the patient is being reviewed. [phone] and [phone] both busy at 10:32. 2)[facility]- does not take medicaid for adults 3 )[facility], RN asked to fax full packet when have IQ scores only. At present the writer is attempting to obtain the IQ scores. [facility] reports they do have a bed if we can verify that pt is dually dx DD and Emotional difficulties. Attempting to get IQ scores or verification that pt is working with [employer].   Called to speak with ED nurse to determine if [facility] has come to meet with pt. Nurse reports that nothing in the chart indicating pt is working with [employer]. This Clinical research associate will follow-up with [facility] to see if pt is enrolled. Locating IQ records to send to [facility] verify pt does meet criteria for open bed.

## 2013-12-28 NOTE — Progress Notes (Signed)
Writer faxed Discharge assessment from [facility] to [place] for review.  Writer will follow up with referral.

## 2013-12-28 NOTE — ED Notes (Signed)
Security and sitter to shower with patient.

## 2013-12-28 NOTE — Progress Notes (Signed)
Called [facility] - remained busy. Called [facility] transfer station *([place])  to see if they had bed availability. No adult bed available at this time.

## 2013-12-28 NOTE — ED Notes (Signed)
Patient's vitals are normal limits. Patient snoring at this time.

## 2013-12-29 MED ORDER — TRAZODONE HCL 50 MG PO TABS
50.0000 mg | ORAL_TABLET | Freq: Once | ORAL | Status: AC
  Administered 2013-12-29: 50 mg via ORAL
  Filled 2013-12-29: qty 1

## 2013-12-29 MED ORDER — LURASIDONE HCL 120 MG PO TABS: 120.0000 mg | ORAL_TABLET | Freq: Every day | ORAL | Status: DC

## 2013-12-29 NOTE — Progress Notes (Signed)
CSW spoke with care coordinator, who states there is no history of I/DD, or any sign of low IQ. Per care coordinator, patient family also do not have a history of patient having any I/DD.   Per care coordinator, patient presents with a lot of delusions, which tangential speech, and speaks of super natural powers.   Per care coordinator patient was in [facility] 4/15 and went to live with Dad, presented back to the ED 2 weeks later and admitted to [facility]. Patient then return to the ED a week later due to continued SI. Patient care coordiantor states that patient is on board for group home placement and feels that staying with Dad until placement is found. Pt coordinator in process of submitting for pasarr, which this csw will assist with once pt psychiatrically stable.   Per care coordinator pt admitted that pt became more upset because he saw a fill in psychiatrist at [facility] who has a history of being [place] and they did not continue pt [place].   Per care coordinator pt is OWN guardian.   Byrd Hesselbach [phone]  ED CSW 12/29/2013 819am

## 2013-12-29 NOTE — Progress Notes (Signed)
Per discussion with psychiatrist and NP, patient psychiatrically stable for dc home. Patient plans to f/u with care coordinator Jenel Lucks, [phone], who plans to meet with pt here in the ED at 1045am. Patient care coordiantor to continue to help with placement. Pt to dc home with father while waiting for placement. Patient also plans to follow up with [facility].   Byrd Hesselbach [phone]  ED CSW 12/29/2013 1015am

## 2013-12-29 NOTE — BH Assessment (Signed)
Pt has been declined at [facility]. Contacted the following facilities for placement:  AT [facility]AC[facility]:  [facility]: Per Alfonso Ramus: [facility]: [facility]: [facility]: Per [facility]: [facility]: Per [facility]: Per [facility]: Per [facility]: Per Elnita Maxwell  PT IS CURRENTLY UNDER REVIEW: [facility]  PT DECLINED: [facility]   [address] [place], [place], [facility] Triage Specialist [phone]

## 2013-12-29 NOTE — [facility] Suicide Risk Assessment (Signed)
Suicide Risk Assessment  Discharge Assessment     Demographic Factors:  Male, Adolescent or young adult and Caucasian  Total Time spent with patient: 45 minutes  Psychiatric Specialty Exam:     Blood pressure 111/74, pulse 58, temperature 97.6 F (36.4 C), temperature source Oral, resp. rate 20, height 5\' 7"  (1.702 m), weight 112.946 kg (249 lb), SpO2 90.00%.Body mass index is 38.99 kg/(m^2).  General Appearance: Casual  Eye Contact::  Good  Speech:  Clear and Coherent  Volume:  Normal  Mood:  Anxious  Affect:  Appropriate  Thought Process:  Coherent  Orientation:  Full (Time, Place, and Person)  Thought Content:  Delusions  Suicidal Thoughts:  No  Homicidal Thoughts:  No  Memory:  Immediate;   Good Recent;   Good Remote;   Good  Judgement:  Impaired  Insight:  Shallow  Psychomotor Activity:  Normal  Concentration:  Good  Recall:  Good  Fund of Knowledge:Good  Language: Good  Akathisia:  Negative  Handed:  Right  AIMS (if indicated):     Assets:  Architect Housing Physical Health Social Support Transportation  Sleep:       Musculoskeletal: Strength & Muscle Tone: within normal limits Gait & Station: normal Patient leans: N/A   Mental Status Per Nursing Assessment::   On Admission:     Current Mental Status by Physician: NA  Loss Factors: NA  Historical Factors: NA  Risk Reduction Factors:   Living with another person, especially a relative, Positive social support and Positive therapeutic relationship  Continued Clinical Symptoms:  Schizophrenia:   Less than 32 years old  Cognitive Features That Contribute To Risk:  Thought constriction (tunnel vision)    Suicide Risk:  Minimal: No identifiable suicidal ideation.  Patients presenting with no risk factors but with morbid ruminations; may be classified as minimal risk based on the severity of the depressive symptoms  Discharge Diagnoses:   AXIS I:   Schizoaffective Disorder AXIS II:  Deferred AXIS III:   Past Medical History  Diagnosis Date  . PTSD (post-traumatic stress disorder)   . Anxiety   . Panic attack   . Dissociative identity disorder   . Asperger syndrome   . Autism   . GERD (gastroesophageal reflux disease)   . Schizophrenia, schizo-affective   . Bipolar disorder   . Hypertension   . Depression    AXIS IV:  chronic mental illness AXIS V:  61-70 mild symptoms  Plan Of Care/Follow-up recommendations:  Activity:  resume usual activity Diet:  resume usual diet  Is patient on multiple antipsychotic therapies at discharge:  No   Has Patient had three or more failed trials of antipsychotic monotherapy by history:  No  Recommended Plan for Multiple Antipsychotic Therapies: NA    Benjaman Pott 12/29/2013, 10:43 AM
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
## 2013-12-29 NOTE — [facility] Counselor (Signed)
Dr. Ladona Ridgel rescinded pt's IVC. Writer faxed to [employer] and originals placed in IVC NB in psych ED.  Evette Cristal, [place] Assessment Counselor

## 2013-12-31 NOTE — Consult Note (Signed)
Case discussed, patient needs inpatient psychiatric admission

## 2014-01-09 ENCOUNTER — Telehealth: Payer: Self-pay | Admitting: Family Medicine

## 2014-01-09 DIAGNOSIS — J302 Other seasonal allergic rhinitis: Secondary | ICD-10-CM

## 2014-01-09 MED ORDER — OMEPRAZOLE 20 MG PO CPDR: 20.0000 mg | DELAYED_RELEASE_CAPSULE | Freq: Every day | ORAL | Status: DC

## 2014-01-09 MED ORDER — CETIRIZINE HCL 10 MG PO TABS: 10.0000 mg | ORAL_TABLET | Freq: Every day | ORAL | Status: DC

## 2014-01-09 MED ORDER — FLUTICASONE PROPIONATE 50 MCG/ACT NA SUSP: 2.0000 | Freq: Every day | NASAL | Status: DC

## 2014-01-09 MED ORDER — PANTOPRAZOLE SODIUM 40 MG PO TBEC: 40.0000 mg | DELAYED_RELEASE_TABLET | Freq: Every day | ORAL | Status: DC

## 2014-01-09 NOTE — Telephone Encounter (Signed)
Patient requesting refill on Flonase, Zyrtec, and Protonix. He originally asked for prilosec but after further discussion it was determined that he is taking Protonix.  Refills provided. Patient aware.

## 2014-01-30 ENCOUNTER — Encounter: Payer: Self-pay | Admitting: Nurse Practitioner

## 2014-01-30 ENCOUNTER — Ambulatory Visit (INDEPENDENT_AMBULATORY_CARE_PROVIDER_SITE_OTHER): Payer: Medicaid Other | Admitting: Nurse Practitioner

## 2014-01-30 VITALS — BP 138/90 | HR 84 | Temp 98.6°F | Ht 67.0 in | Wt 263.4 lb

## 2014-01-30 DIAGNOSIS — Z593 Problems related to living in residential institution: Secondary | ICD-10-CM

## 2014-01-30 DIAGNOSIS — F3289 Other specified depressive episodes: Secondary | ICD-10-CM

## 2014-01-30 DIAGNOSIS — F329 Major depressive disorder, single episode, unspecified: Secondary | ICD-10-CM

## 2014-01-30 DIAGNOSIS — F32A Depression, unspecified: Secondary | ICD-10-CM

## 2014-01-30 DIAGNOSIS — Z111 Encounter for screening for respiratory tuberculosis: Secondary | ICD-10-CM

## 2014-01-30 DIAGNOSIS — F259 Schizoaffective disorder, unspecified: Secondary | ICD-10-CM
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
## 2014-02-01 ENCOUNTER — Encounter: Payer: Self-pay | Admitting: *Deleted

## 2014-02-01 LAB — TB SKIN TEST
Induration: 0 mm
TB Skin Test: NEGATIVE

## 2014-02-02 ENCOUNTER — Other Ambulatory Visit: Payer: Self-pay | Admitting: *Deleted

## 2014-02-02 MED ORDER — LURASIDONE HCL 120 MG PO TABS: 120.0000 mg | ORAL_TABLET | Freq: Every day | ORAL | Status: DC

## 2014-02-02 NOTE — Telephone Encounter (Signed)
Last ov 01/30/14

## 2014-02-04 ENCOUNTER — Other Ambulatory Visit: Payer: Self-pay | Admitting: Nurse Practitioner

## 2014-02-04 MED ORDER — LURASIDONE HCL 120 MG PO TABS: 120.0000 mg | ORAL_TABLET | Freq: Every day | ORAL | Status: DC

## 2014-02-06 ENCOUNTER — Telehealth: Payer: Self-pay | Admitting: *Deleted

## 2014-02-06 NOTE — Telephone Encounter (Signed)
Patient is in an adult care facility, [facility], and they called to verify medication instructions. His meds are divided into AM and PM but patient complains that they are making him drowsy.  They questioned whether his zyrtec could be taken in the morning and whether his zyprexa and ativan could be PRN. These are prescribed as PRN and he doesn't have to take them unless needed. Directions for Zyrtec are daily. He can take that in the morning or afternoon. Advised caregiver of this.

## 2014-02-07 ENCOUNTER — Telehealth: Payer: Self-pay | Admitting: Nurse Practitioner

## 2014-02-07 NOTE — Telephone Encounter (Signed)
Spoke with [facility]. This was handled yesterday. He is trying to have the company that prepackages their medication leave out the PRN meds to be distributed PRN.

## 2014-02-08 ENCOUNTER — Telehealth: Payer: Self-pay | Admitting: Nurse Practitioner

## 2014-03-01 ENCOUNTER — Other Ambulatory Visit: Payer: Self-pay | Admitting: Family Medicine

## 2014-03-03 NOTE — Telephone Encounter (Signed)
Please call in ativan with 1 refills 

## 2014-03-03 NOTE — Telephone Encounter (Signed)
Last seen 01/30/14  MMM   If approved route to nurse to call into [facility]  [phone]

## 2014-03-04 NOTE — Telephone Encounter (Signed)
Called in.

## 2014-03-13 ENCOUNTER — Telehealth: Payer: Self-pay | Admitting: Nurse Practitioner

## 2014-03-13 NOTE — Telephone Encounter (Signed)
Spoke with [facility] regarding appt She will call back to confirm

## 2014-03-15 ENCOUNTER — Ambulatory Visit (INDEPENDENT_AMBULATORY_CARE_PROVIDER_SITE_OTHER): Payer: Medicaid Other | Admitting: Nurse Practitioner

## 2014-03-15 ENCOUNTER — Encounter: Payer: Self-pay | Admitting: Nurse Practitioner

## 2014-03-15 VITALS — BP 133/85 | HR 87 | Temp 97.5°F | Ht 67.0 in | Wt 262.0 lb

## 2014-03-15 DIAGNOSIS — F3289 Other specified depressive episodes: Secondary | ICD-10-CM

## 2014-03-15 DIAGNOSIS — G4700 Insomnia, unspecified: Secondary | ICD-10-CM | POA: Insufficient documentation

## 2014-03-15 DIAGNOSIS — E669 Obesity, unspecified: Secondary | ICD-10-CM

## 2014-03-15 DIAGNOSIS — R7989 Other specified abnormal findings of blood chemistry: Secondary | ICD-10-CM

## 2014-03-15 DIAGNOSIS — K219 Gastro-esophageal reflux disease without esophagitis: Secondary | ICD-10-CM | POA: Insufficient documentation

## 2014-03-15 DIAGNOSIS — Z0000 Encounter for general adult medical examination without abnormal findings: Secondary | ICD-10-CM

## 2014-03-15 DIAGNOSIS — I10 Essential (primary) hypertension: Secondary | ICD-10-CM

## 2014-03-15 DIAGNOSIS — F32A Depression, unspecified: Secondary | ICD-10-CM

## 2014-03-15 DIAGNOSIS — R7309 Other abnormal glucose: Secondary | ICD-10-CM

## 2014-03-15 DIAGNOSIS — R635 Abnormal weight gain: Secondary | ICD-10-CM

## 2014-03-15 DIAGNOSIS — Z111 Encounter for screening for respiratory tuberculosis: Secondary | ICD-10-CM

## 2014-03-15 DIAGNOSIS — Z7251 High risk heterosexual behavior: Secondary | ICD-10-CM

## 2014-03-15 DIAGNOSIS — F259 Schizoaffective disorder, unspecified: Secondary | ICD-10-CM

## 2014-03-15 DIAGNOSIS — F329 Major depressive disorder, single episode, unspecified: Secondary | ICD-10-CM

## 2014-03-15 DIAGNOSIS — D62 Acute posthemorrhagic anemia: Secondary | ICD-10-CM

## 2014-03-15 DIAGNOSIS — F4310 Post-traumatic stress disorder, unspecified: Secondary | ICD-10-CM

## 2014-03-15 LAB — POCT CBC
Granulocyte percent: 60.1 %G (ref 37–80)
HEMATOCRIT: 43.7 % (ref 43.5–53.7)
Hemoglobin: 14.9 g/dL (ref 14.1–18.1)
Lymph, poc: 2.5 (ref 0.6–3.4)
MCH: 28.4 pg (ref 27–31.2)
MCHC: 34 g/dL (ref 31.8–35.4)
MCV: 83.6 fL (ref 80–97)
MPV: 7.7 fL (ref 0–99.8)
POC Granulocyte: 3.9 (ref 2–6.9)
POC LYMPH PERCENT: 38.4 %L (ref 10–50)
Platelet Count, POC: 127 10*3/uL — AB (ref 142–424)
RBC: 5.2 M/uL (ref 4.69–6.13)
RDW, POC: 13.8 %
WBC: 6.5 10*3/uL (ref 4.6–10.2)
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
## 2014-03-15 NOTE — Addendum Note (Signed)
Addended by: Cleda DaubUCKER, Issabella Rix G on: 03/15/2014 03:19 PM   Modules accepted: Orders

## 2014-03-15 NOTE — Patient Instructions (Addendum)
Stress and Stress Management Stress is a normal reaction to life events. It is what you feel when life demands more than you are used to or more than you can handle. Some stress can be useful. For example, the stress reaction can help you catch the last bus of the day, study for a test, or meet a deadline at work. But stress that occurs too often or for too long can cause problems. It can affect your emotional health and interfere with relationships and normal daily activities. Too much stress can weaken your immune system and increase your risk for physical illness. If you already have a medical problem, stress can make it worse. CAUSES  All sorts of life events may cause stress. An event that causes stress for one person may not be stressful for another person. Major life events commonly cause stress. These may be positive or negative. Examples include losing your job, moving into a new home, getting married, having a baby, or losing a loved one. Less obvious life events may also cause stress, especially if they occur day after day or in combination. Examples include working long hours, driving in traffic, caring for children, being in debt, or being in a difficult relationship. SIGNS AND SYMPTOMS Stress may cause emotional symptoms including, the following:  Anxiety. This is feeling worried, afraid, on edge, overwhelmed, or out of control.  Anger. This is feeling irritated or impatient.  Depression. This is feeling sad, down, helpless, or guilty.  Difficulty focusing, remembering, or making decisions. Stress may cause physical symptoms, including the following:   Aches and pains. These may affect your head, neck, back, stomach, or other areas of your body.  Tight muscles or clenched jaw.  Low energy or trouble sleeping. Stress may cause unhealthy behaviors, including the following:   Eating to feel better (overeating) or skipping meals.  Sleeping too little, too much, or both.  Working  too much or putting off tasks (procrastination).  Smoking, drinking alcohol, or using drugs to feel better. DIAGNOSIS  Stress is diagnosed through an assessment by your health care provider. Your health care provider will ask questions about your symptoms and any stressful life events.Your health care provider will also ask about your medical history and may order blood tests or other tests. Certain medical conditions and medicine can cause physical symptoms similar to stress. Mental illness can cause emotional symptoms and unhealthy behaviors similar to stress. Your health care provider may refer you to a mental health professional for further evaluation.  TREATMENT  Stress management is the recommended treatment for stress.The goals of stress management are reducing stressful life events and coping with stress in healthy ways.  Techniques for reducing stressful life events include the following:  Stress identification. Self-monitor for stress and identify what causes stress for you. These skills may help you to avoid some stressful events.  Time management. Set your priorities, keep a calendar of events, and learn to say "no." These tools can help you avoid making too many commitments. Techniques for coping with stress include the following:  Rethinking the problem. Try to think realistically about stressful events rather than ignoring them or overreacting. Try to find the positives in a stressful situation rather than focusing on the negatives.  Exercise. Physical exercise can release both physical and emotional tension. The key is to find a form of exercise you enjoy and do it regularly.  Relaxation techniques. These relax the body and mind. Examples include yoga, meditation, tai chi, biofeedback, deep  breathing, progressive muscle relaxation, listening to music, being out in nature, journaling, and other hobbies. Again, the key is to find one or more that you enjoy and can do  regularly.  Healthy lifestyle. Eat a balanced diet, get plenty of sleep, and do not smoke. Avoid using alcohol or drugs to relax.  Strong support network. Spend time with family, friends, or other people you enjoy being around.Express your feelings and talk things over with someone you trust. Counseling or talktherapy with a mental health professional may be helpful if you are having difficulty managing stress on your own. Medicine is typically not recommended for the treatment of stress.Talk to your health care provider if you think you need medicine for symptoms of stress. HOME CARE INSTRUCTIONS  Keep all follow-up visits as directed by your health care provider.  Take all medicines as directed by your health care provider. SEEK MEDICAL CARE IF:  Your symptoms get worse or you start having new symptoms.  You feel overwhelmed by your problems and can no longer manage them on your own. SEEK IMMEDIATE MEDICAL CARE IF:  You feel like hurting yourself or someone else. Document Released: 01/07/2001 Document Revised: 11/28/2013 Document Reviewed: 03/08/2013 ExitCare Patient Information 2015 [employer]. This information is not intended to replace advice given to you by your health care provider. Make sure you discuss any questions you have with your health care provider.   

## 2014-03-16 ENCOUNTER — Ambulatory Visit: Payer: Medicaid Other | Admitting: Nurse Practitioner

## 2014-03-16 ENCOUNTER — Other Ambulatory Visit: Payer: Self-pay | Admitting: Nurse Practitioner

## 2014-03-16 LAB — CMP14+EGFR
A/G RATIO: 1.7 (ref 1.1–2.5)
ALK PHOS: 63 IU/L (ref 39–117)
ALT: 33 IU/L (ref 0–44)
AST: 21 IU/L (ref 0–40)
Albumin: 4.4 g/dL (ref 3.5–5.5)
BILIRUBIN TOTAL: 0.4 mg/dL (ref 0.0–1.2)
BUN / CREAT RATIO: 16 (ref 8–19)
BUN: 14 mg/dL (ref 6–20)
CO2: 25 mmol/L (ref 18–29)
Calcium: 9.5 mg/dL (ref 8.7–10.2)
Chloride: 99 mmol/L (ref 97–108)
Creatinine, Ser: 0.86 mg/dL (ref 0.76–1.27)
GFR, EST AFRICAN AMERICAN: 138 mL/min/{1.73_m2} (ref >59)
GFR, EST NON AFRICAN AMERICAN: 120 mL/min/{1.73_m2} (ref >59)
Globulin, Total: 2.6 g/dL (ref 1.5–4.5)
Glucose: 129 mg/dL — ABNORMAL HIGH (ref 65–99)
POTASSIUM: 4.2 mmol/L (ref 3.5–5.2)
SODIUM: 142 mmol/L (ref 134–144)
Total Protein: 7 g/dL (ref 6.0–8.5)

## 2014-03-16 LAB — STD PANEL
HEP B S AG: NEGATIVE
HIV 1/HIV 2 AB: NONREACTIVE
RPR: NONREACTIVE

## 2014-03-16 LAB — NMR, LIPOPROFILE
Cholesterol: 237 mg/dL — ABNORMAL HIGH (ref 100–199)
HDL Cholesterol by NMR: 19 mg/dL — ABNORMAL LOW (ref >39)
HDL Particle Number: 20.2 umol/L — ABNORMAL LOW (ref >=30.5)
LDL Particle Number: 2354 nmol/L — ABNORMAL HIGH (ref <1000)
LDL SIZE: 19.9 nm (ref >20.5)
LP-IR Score: 97 — ABNORMAL HIGH (ref <=45)
Small LDL Particle Number: 1769 nmol/L — ABNORMAL HIGH (ref <=527)
TRIGLYCERIDES BY NMR: 526 mg/dL — AB (ref 0–149)

## 2014-03-16 MED ORDER — ROSUVASTATIN CALCIUM 10 MG PO TABS: 10.0000 mg | ORAL_TABLET | Freq: Every day | ORAL | Status: DC

## 2014-03-16 MED ORDER — FENOFIBRATE 160 MG PO TABS: 160.0000 mg | ORAL_TABLET | Freq: Every day | ORAL | Status: DC

## 2014-03-20 ENCOUNTER — Telehealth: Payer: Self-pay | Admitting: Nurse Practitioner

## 2014-03-20 ENCOUNTER — Other Ambulatory Visit: Payer: Self-pay | Admitting: Nurse Practitioner

## 2014-03-20 MED ORDER — GEMFIBROZIL 600 MG PO TABS: 600.0000 mg | ORAL_TABLET | Freq: Two times a day (BID) | ORAL | Status: DC

## 2014-03-20 MED ORDER — ATORVASTATIN CALCIUM 80 MG PO TABS: 80.0000 mg | ORAL_TABLET | Freq: Every day | ORAL | Status: DC

## 2014-03-21 ENCOUNTER — Telehealth: Payer: Self-pay | Admitting: Nurse Practitioner

## 2014-03-21 NOTE — Telephone Encounter (Signed)
done

## 2014-03-21 NOTE — Telephone Encounter (Signed)
Vonshell was advised to fax forms over and we would have the documentation added to his forms.

## 2014-03-23 NOTE — Telephone Encounter (Signed)
a1c was not added. It is too late to add it at this point. Patient will have that done at his next appt.

## 2014-04-03 ENCOUNTER — Other Ambulatory Visit (HOSPITAL_COMMUNITY): Payer: Self-pay | Admitting: Family Medicine

## 2014-04-04 LAB — POCT GLYCOSYLATED HEMOGLOBIN (HGB A1C): HEMOGLOBIN A1C: 7

## 2014-04-04 NOTE — Addendum Note (Signed)
Addended by: Orma Render F on: 04/04/2014 05:51 PM   Modules accepted: Orders

## 2014-04-05 ENCOUNTER — Telehealth: Payer: Self-pay | Admitting: Nurse Practitioner

## 2014-04-06 ENCOUNTER — Other Ambulatory Visit: Payer: Self-pay | Admitting: Nurse Practitioner

## 2014-04-06 ENCOUNTER — Encounter: Payer: Self-pay | Admitting: Nurse Practitioner

## 2014-04-06 DIAGNOSIS — E119 Type 2 diabetes mellitus without complications: Secondary | ICD-10-CM | POA: Insufficient documentation

## 2014-04-06 DIAGNOSIS — E559 Vitamin D deficiency, unspecified: Secondary | ICD-10-CM

## 2014-04-06 MED ORDER — METFORMIN HCL 500 MG PO TABS: 500.0000 mg | ORAL_TABLET | Freq: Two times a day (BID) | ORAL | Status: DC

## 2014-04-07 MED ORDER — BLOOD GLUCOSE MONITORING SUPPL W/DEVICE KIT: 1.0000 [IU] | PACK | Freq: Two times a day (BID) | Status: AC

## 2014-04-07 NOTE — Progress Notes (Signed)
k to give him order for vitamin d level

## 2014-04-07 NOTE — Telephone Encounter (Signed)
Faxed requested documentation as well as a not with directions to start metformin and schedule an appointment with our clinical pharmacist for a new diabetic.

## 2014-04-07 NOTE — Telephone Encounter (Signed)
It is to soon for him to have labs right now- can do vitamin d 2000 iu OTC daily without rx- will check labs at next visit. Please send TB skin test results as requested.

## 2014-04-07 NOTE — Addendum Note (Signed)
Addended by: Gwenith Daily on: 04/07/2014 04:41 PM   Modules accepted: Orders

## 2014-04-07 NOTE — Progress Notes (Signed)
If patient is to take Vitamin D either OTC or RX they will need an order to administer it. If he needs RX strength he will need labs drawn and they are requesting an order for a local lab. He will have to buy OTC out of his pocket and the facility nurse doesn't think this is an option for him. Basically they need an order for the lab or an order to D/C vitamin D.

## 2014-04-10 NOTE — Progress Notes (Signed)
Order faxed to [facility] in [place] for Vit D level. Fax # [phone].  Group home notified.

## 2014-04-10 NOTE — Addendum Note (Signed)
Addended by: Gwenith Daily on: 04/10/2014 09:17 AM   Modules accepted: Orders

## 2014-04-11 ENCOUNTER — Telehealth: Payer: Self-pay | Admitting: Nurse Practitioner

## 2014-04-11 MED ORDER — GLIMEPIRIDE 4 MG PO TABS: 4.0000 mg | ORAL_TABLET | Freq: Every day | ORAL | Status: DC

## 2014-04-11 NOTE — Telephone Encounter (Signed)
Changed metformin to amaryl

## 2014-04-12 ENCOUNTER — Telehealth: Payer: Self-pay | Admitting: Nurse Practitioner

## 2014-04-12 NOTE — Telephone Encounter (Signed)
lmtcb

## 2014-04-12 NOTE — Telephone Encounter (Signed)
Spoke with [facility]. He is giving them a fit about checking his glucose checks. They just wanted you to know

## 2014-04-12 NOTE — Telephone Encounter (Signed)
They are aware to discontinued verbal

## 2014-04-12 NOTE — Telephone Encounter (Signed)
Sorry but has to be checked

## 2014-04-19 ENCOUNTER — Telehealth: Payer: Self-pay | Admitting: Nurse Practitioner

## 2014-04-19 DIAGNOSIS — E559 Vitamin D deficiency, unspecified: Secondary | ICD-10-CM

## 2014-04-19 NOTE — Telephone Encounter (Signed)
The lab did not do vitamin d as ordered but we will send in rx = where does it need to go.

## 2014-04-20 MED ORDER — VITAMIN D (ERGOCALCIFEROL) 1.25 MG (50000 UNIT) PO CAPS: 50000.0000 [IU] | ORAL_CAPSULE | ORAL | Status: DC

## 2014-04-20 NOTE — Telephone Encounter (Signed)
[facility] in [place] it is in [facility]

## 2014-04-20 NOTE — Telephone Encounter (Signed)
rx sent to pharmacy

## 2014-04-28 ENCOUNTER — Telehealth: Payer: Self-pay | Admitting: Nurse Practitioner

## 2014-04-28 NOTE — Telephone Encounter (Signed)
Is this okay?

## 2014-04-30 NOTE — Telephone Encounter (Signed)
neded to know what kind of meter he uses

## 2014-05-01 NOTE — Telephone Encounter (Signed)
Patient has supplies but would like to do his own testing. He lives in a group home and they need an order for him to be able to do it himself.  Order printed and will fax.

## 2014-07-04 ENCOUNTER — Other Ambulatory Visit: Payer: Self-pay | Admitting: Nurse Practitioner

## 2014-07-04 ENCOUNTER — Telehealth: Payer: Self-pay | Admitting: Nurse Practitioner

## 2014-07-04 NOTE — Telephone Encounter (Signed)
MM out for vacation.  If we can look into notes and answer any questions , we will.  Please return our call.

## 2014-07-05 NOTE — Telephone Encounter (Signed)
FYI     Waiting for return call.

## 2014-07-07 ENCOUNTER — Telehealth: Payer: Self-pay | Admitting: Nurse Practitioner

## 2014-07-07 ENCOUNTER — Telehealth: Payer: Self-pay | Admitting: *Deleted

## 2014-07-07 MED ORDER — LORAZEPAM 1 MG PO TABS: 1.0000 mg | ORAL_TABLET | Freq: Two times a day (BID) | ORAL | Status: DC | PRN

## 2014-07-07 NOTE — Telephone Encounter (Signed)
Please call in lorazepam 1mg  1 po qd prn with 0 refills

## 2014-07-07 NOTE — Telephone Encounter (Signed)
Give call to nurse

## 2014-07-07 NOTE — Telephone Encounter (Signed)
He is on his last day of meds, [place]. [facility] will pick them up for him. I put the new Pharmacy in. Please make sure someone calls his lorazepam in today!

## 2014-07-10 NOTE — Telephone Encounter (Signed)
[facility] [place].  Message left for  Georgette at [facility].

## 2014-07-11 ENCOUNTER — Telehealth: Payer: Self-pay | Admitting: Nurse Practitioner

## 2014-07-12 MED ORDER — CETIRIZINE HCL 10 MG PO TABS: ORAL_TABLET | ORAL | Status: AC

## 2014-07-12 NOTE — Telephone Encounter (Signed)
done

## 2014-07-18 ENCOUNTER — Telehealth: Payer: Self-pay | Admitting: Nurse Practitioner

## 2014-07-18 NOTE — Telephone Encounter (Signed)
Pt needed list of diabetes and cholesterol meds List given to pt

## 2014-08-01 ENCOUNTER — Other Ambulatory Visit: Payer: Self-pay

## 2014-08-01 MED ORDER — GLIMEPIRIDE 4 MG PO TABS: 4.0000 mg | ORAL_TABLET | Freq: Every day | ORAL | Status: DC

## 2014-08-11 ENCOUNTER — Other Ambulatory Visit: Payer: Self-pay | Admitting: Nurse Practitioner

## 2014-08-11 NOTE — Telephone Encounter (Signed)
Need to know whta kind of meter he has to order lancets

## 2014-08-12 ENCOUNTER — Other Ambulatory Visit: Payer: Self-pay | Admitting: Nurse Practitioner

## 2014-08-12 MED ORDER — ACCU-CHEK MULTICLIX LANCETS MISC: Status: AC

## 2014-08-12 MED ORDER — ALCOHOL PREPS PADS: 1.0000 | MEDICATED_PAD | Freq: Every day | Status: AC

## 2014-08-12 MED ORDER — GLUCOSE BLOOD VI STRP: ORAL_STRIP | Status: AC

## 2014-08-12 NOTE — Telephone Encounter (Signed)
Patient has a accu check aviva monitor- and he needs lancets, strips and alcohol pads.

## 2014-08-12 NOTE — Telephone Encounter (Signed)
rx sent

## 2014-08-12 NOTE — Telephone Encounter (Signed)
Last filled 07/10/2014

## 2014-08-14 NOTE — Telephone Encounter (Signed)
Left detailed msg stating rx sent to pharmacy. Will close encounter.

## 2014-08-14 NOTE — Telephone Encounter (Signed)
Please call in ativan with 0 refills 

## 2014-08-14 NOTE — Telephone Encounter (Signed)
rx called into pharmacy

## 2014-08-15 ENCOUNTER — Other Ambulatory Visit: Payer: Self-pay | Admitting: Nurse Practitioner

## 2014-08-15 ENCOUNTER — Other Ambulatory Visit: Payer: Self-pay | Admitting: *Deleted

## 2014-08-15 MED ORDER — PANTOPRAZOLE SODIUM 40 MG PO TBEC: 40.0000 mg | DELAYED_RELEASE_TABLET | Freq: Every day | ORAL | Status: DC

## 2014-08-15 MED ORDER — GLIMEPIRIDE 4 MG PO TABS: 4.0000 mg | ORAL_TABLET | Freq: Every day | ORAL | Status: AC

## 2014-08-15 MED ORDER — ATORVASTATIN CALCIUM 80 MG PO TABS: 80.0000 mg | ORAL_TABLET | Freq: Every day | ORAL | Status: AC

## 2014-08-15 MED ORDER — METOPROLOL TARTRATE 25 MG PO TABS: 25.0000 mg | ORAL_TABLET | Freq: Two times a day (BID) | ORAL | Status: DC

## 2014-08-15 MED ORDER — OLANZAPINE 10 MG PO TABS: 10.0000 mg | ORAL_TABLET | Freq: Every day | ORAL | Status: DC

## 2014-08-15 NOTE — Telephone Encounter (Signed)
done

## 2014-09-18 ENCOUNTER — Ambulatory Visit: Payer: Medicaid Other | Admitting: Nurse Practitioner

## 2014-10-06 ENCOUNTER — Other Ambulatory Visit: Payer: Self-pay | Admitting: *Deleted

## 2014-10-06 MED ORDER — GEMFIBROZIL 600 MG PO TABS: 600.0000 mg | ORAL_TABLET | Freq: Two times a day (BID) | ORAL | Status: AC

## 2014-10-06 MED ORDER — VITAMIN D (ERGOCALCIFEROL) 1.25 MG (50000 UNIT) PO CAPS: ORAL_CAPSULE | ORAL | Status: AC

## 2014-10-09 ENCOUNTER — Telehealth: Payer: Self-pay | Admitting: Nurse Practitioner

## 2014-10-09 NOTE — Telephone Encounter (Signed)
Pt to call pharmacy to send refill request °

## 2014-10-09 NOTE — Telephone Encounter (Signed)
Pt to call pharmacy to send refill request

## 2014-10-09 NOTE — Telephone Encounter (Signed)
No refills on meds has not been seen since 01/2014

## 2014-10-09 NOTE — Telephone Encounter (Signed)
Patient aware that he needs to be seen. 

## 2014-12-18 ENCOUNTER — Other Ambulatory Visit: Payer: Self-pay

## 2014-12-18 MED ORDER — PANTOPRAZOLE SODIUM 40 MG PO TBEC: 40.0000 mg | DELAYED_RELEASE_TABLET | Freq: Every day | ORAL | Status: AC

## 2015-01-22 ENCOUNTER — Other Ambulatory Visit: Payer: Self-pay

## 2015-01-27 ENCOUNTER — Other Ambulatory Visit: Payer: Self-pay | Admitting: Nurse Practitioner

## 2015-01-30 ENCOUNTER — Other Ambulatory Visit: Payer: Self-pay | Admitting: Nurse Practitioner

## 2015-02-02 ENCOUNTER — Other Ambulatory Visit: Payer: Self-pay | Admitting: Nurse Practitioner

## 2015-03-24 ENCOUNTER — Other Ambulatory Visit: Payer: Self-pay | Admitting: Nurse Practitioner

## 2015-03-26 NOTE — Telephone Encounter (Signed)
No refill on meds with out being seen

## 2015-03-26 NOTE — Telephone Encounter (Signed)
Last seen 03/15/14 MMM   Don't see a Vit D in EPIC

## 2015-04-09 ENCOUNTER — Other Ambulatory Visit: Payer: Self-pay | Admitting: Nurse Practitioner

## 2017-01-23 ENCOUNTER — Encounter (HOSPITAL_COMMUNITY): Payer: Self-pay | Admitting: Emergency Medicine

## 2017-01-23 ENCOUNTER — Emergency Department (HOSPITAL_COMMUNITY)
Admission: EM | Admit: 2017-01-23 | Discharge: 2017-01-23 | Disposition: A | Discharge status: Home / Self Care | Payer: Medicaid Other | Attending: Emergency Medicine | Admitting: Emergency Medicine

## 2017-01-23 DIAGNOSIS — R739 Hyperglycemia, unspecified: Secondary | ICD-10-CM

## 2017-01-23 DIAGNOSIS — I10 Essential (primary) hypertension: Secondary | ICD-10-CM | POA: Diagnosis not present

## 2017-01-23 DIAGNOSIS — Z79899 Other long term (current) drug therapy: Secondary | ICD-10-CM | POA: Diagnosis not present

## 2017-01-23 DIAGNOSIS — R42 Dizziness and giddiness: Secondary | ICD-10-CM | POA: Diagnosis present

## 2017-01-23 DIAGNOSIS — E1165 Type 2 diabetes mellitus with hyperglycemia: Secondary | ICD-10-CM | POA: Insufficient documentation

## 2017-01-23 DIAGNOSIS — Z7984 Long term (current) use of oral hypoglycemic drugs: Secondary | ICD-10-CM | POA: Diagnosis not present

## 2017-01-23 LAB — URINALYSIS, ROUTINE W REFLEX MICROSCOPIC
Bacteria, UA: NONE SEEN
Bilirubin Urine: NEGATIVE
Glucose, UA: >=500 mg/dL — AB
HGB URINE DIPSTICK: NEGATIVE
KETONES UR: NEGATIVE mg/dL
LEUKOCYTES UA: NEGATIVE
Nitrite: NEGATIVE
PROTEIN: NEGATIVE mg/dL
Specific Gravity, Urine: 1.022 (ref 1.005–1.030)
Squamous Epithelial / LPF: NONE SEEN
pH: 5 (ref 5.0–8.0)

## 2017-01-23 LAB — BASIC METABOLIC PANEL
ANION GAP: 6 (ref 5–15)
BUN: 14 mg/dL (ref 6–20)
CHLORIDE: 103 mmol/L (ref 101–111)
CO2: 27 mmol/L (ref 22–32)
Calcium: 8.4 mg/dL — ABNORMAL LOW (ref 8.9–10.3)
Creatinine, Ser: 1.18 mg/dL (ref 0.61–1.24)
GFR calc non Af Amer: >60 mL/min (ref >60)
Glucose, Bld: 269 mg/dL — ABNORMAL HIGH (ref 65–99)
Potassium: 3.7 mmol/L (ref 3.5–5.1)
Sodium: 136 mmol/L (ref 135–145)

## 2017-01-23 LAB — CBC
HCT: 39.2 % (ref 39.0–52.0)
Hemoglobin: 13.8 g/dL (ref 13.0–17.0)
MCH: 29.6 pg (ref 26.0–34.0)
MCHC: 35.2 g/dL (ref 30.0–36.0)
MCV: 84.1 fL (ref 78.0–100.0)
Platelets: 220 10*3/uL (ref 150–400)
RBC: 4.66 MIL/uL (ref 4.22–5.81)
RDW: 12.4 % (ref 11.5–15.5)
WBC: 8.4 10*3/uL (ref 4.0–10.5)

## 2017-01-23 LAB — CBG MONITORING, ED
GLUCOSE-CAPILLARY: 261 mg/dL — AB (ref 65–99)
Glucose-Capillary: 153 mg/dL — ABNORMAL HIGH (ref 65–99)

## 2017-01-23 MED ORDER — SODIUM CHLORIDE 0.9 % IV BOLUS (SEPSIS)
1000.0000 mL | Freq: Once | INTRAVENOUS | Status: AC
  Administered 2017-01-23: 1000 mL via INTRAVENOUS
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
## 2017-01-23 NOTE — ED Notes (Signed)
Bed: [id number] Expected date:  Expected time:  Means of arrival:  Comments: EMS- 31yo M, dizziness/hyperglycemia

## 2017-01-23 NOTE — Discharge Instructions (Signed)
Please read and follow all provided instructions.  Your diagnoses today include:  1. Hyperglycemia     Tests performed today include:  Blood counts and electrolytes - elevated blood sugar  Vital signs. See below for your results today.   Medications prescribed:   None  Take any prescribed medications only as directed.  Home care instructions:  Follow any educational materials contained in this packet.  Follow-up instructions: Please follow-up with your primary care provider as needed for further evaluation of your symptoms.   Return instructions:   Please return to the Emergency Department if you experience worsening symptoms.   Please return if you have any other emergent concerns.  Additional Information:  Your vital signs today were: BP 139/82 (BP Location: Right Arm)    Pulse (!) 112    Temp 98.3 F (36.8 C)    Resp 19    Ht 5\' 7"  (1.702 m)    Wt 112 kg (247 lb)    SpO2 96%    BMI 38.69 kg/m  If your blood pressure (BP) was elevated above 135/85 this visit, please have this repeated by your doctor within one month. --------------

## 2017-01-23 NOTE — ED Triage Notes (Signed)
Per EMS-states has'nt taken Metformin this am-CBG 365-ate breakfast and lunch without DM coverage-oriented X4
